# Patient Record
Sex: Female | Born: 1975 | Race: White | Hispanic: No | Marital: Married | Smoking: Former smoker
Health system: Southern US, Community
[De-identification: ages and names within clinical notes are randomized; demographics above are authoritative.]

## PROBLEM LIST (undated history)

## (undated) DIAGNOSIS — C4491 Basal cell carcinoma of skin, unspecified: Secondary | ICD-10-CM

## (undated) DIAGNOSIS — G43909 Migraine, unspecified, not intractable, without status migrainosus: Secondary | ICD-10-CM

## (undated) DIAGNOSIS — F419 Anxiety disorder, unspecified: Secondary | ICD-10-CM

## (undated) DIAGNOSIS — M797 Fibromyalgia: Secondary | ICD-10-CM

## (undated) DIAGNOSIS — G894 Chronic pain syndrome: Secondary | ICD-10-CM

## (undated) HISTORY — DX: Chronic pain syndrome: G89.4

## (undated) HISTORY — PX: THYROIDECTOMY, PARTIAL: SHX18

## (undated) HISTORY — DX: Migraine, unspecified, not intractable, without status migrainosus: G43.909

## (undated) HISTORY — DX: Fibromyalgia: M79.7

## (undated) HISTORY — DX: Anxiety disorder, unspecified: F41.9

## (undated) HISTORY — DX: Basal cell carcinoma of skin, unspecified: C44.91

## (undated) HISTORY — PX: UMBILICAL HERNIA REPAIR: SHX196

---

## 2012-03-03 ENCOUNTER — Ambulatory Visit: Payer: Managed Care, Other (non HMO) | Attending: Internal Medicine | Admitting: Physical Therapy

## 2012-03-03 DIAGNOSIS — IMO0001 Reserved for inherently not codable concepts without codable children: Secondary | ICD-10-CM | POA: Insufficient documentation

## 2012-03-03 DIAGNOSIS — M629 Disorder of muscle, unspecified: Secondary | ICD-10-CM | POA: Insufficient documentation

## 2012-03-03 DIAGNOSIS — M2420 Disorder of ligament, unspecified site: Secondary | ICD-10-CM | POA: Insufficient documentation

## 2012-03-03 DIAGNOSIS — M2569 Stiffness of other specified joint, not elsewhere classified: Secondary | ICD-10-CM | POA: Insufficient documentation

## 2012-03-04 ENCOUNTER — Ambulatory Visit: Payer: Managed Care, Other (non HMO) | Admitting: Physical Therapy

## 2012-03-08 ENCOUNTER — Ambulatory Visit: Payer: Managed Care, Other (non HMO) | Admitting: Physical Therapy

## 2012-03-10 ENCOUNTER — Ambulatory Visit: Payer: Managed Care, Other (non HMO) | Admitting: Physical Therapy

## 2012-03-18 ENCOUNTER — Ambulatory Visit: Payer: Managed Care, Other (non HMO) | Admitting: Physical Therapy

## 2012-03-22 ENCOUNTER — Ambulatory Visit: Payer: Managed Care, Other (non HMO) | Admitting: Physical Therapy

## 2012-03-24 ENCOUNTER — Encounter: Payer: Managed Care, Other (non HMO) | Admitting: Physical Therapy

## 2012-03-29 ENCOUNTER — Ambulatory Visit: Payer: Managed Care, Other (non HMO) | Admitting: Physical Therapy

## 2012-03-31 ENCOUNTER — Ambulatory Visit: Payer: Managed Care, Other (non HMO) | Admitting: Physical Therapy

## 2012-04-07 ENCOUNTER — Ambulatory Visit: Payer: Managed Care, Other (non HMO) | Admitting: Physical Therapy

## 2012-04-12 ENCOUNTER — Ambulatory Visit: Payer: Managed Care, Other (non HMO) | Attending: Internal Medicine | Admitting: Physical Therapy

## 2012-04-12 DIAGNOSIS — M2420 Disorder of ligament, unspecified site: Secondary | ICD-10-CM | POA: Insufficient documentation

## 2012-04-12 DIAGNOSIS — M2569 Stiffness of other specified joint, not elsewhere classified: Secondary | ICD-10-CM | POA: Insufficient documentation

## 2012-04-12 DIAGNOSIS — IMO0001 Reserved for inherently not codable concepts without codable children: Secondary | ICD-10-CM | POA: Insufficient documentation

## 2012-04-12 DIAGNOSIS — M629 Disorder of muscle, unspecified: Secondary | ICD-10-CM | POA: Insufficient documentation

## 2012-04-14 ENCOUNTER — Encounter: Payer: Managed Care, Other (non HMO) | Admitting: Physical Therapy

## 2012-04-19 ENCOUNTER — Ambulatory Visit: Payer: Managed Care, Other (non HMO) | Admitting: Physical Therapy

## 2012-04-21 ENCOUNTER — Ambulatory Visit: Payer: Managed Care, Other (non HMO) | Admitting: Physical Therapy

## 2012-04-26 ENCOUNTER — Ambulatory Visit: Payer: Managed Care, Other (non HMO) | Admitting: Physical Therapy

## 2012-04-28 ENCOUNTER — Ambulatory Visit: Payer: Managed Care, Other (non HMO) | Admitting: Physical Therapy

## 2012-05-06 ENCOUNTER — Ambulatory Visit (HOSPITAL_COMMUNITY)
Admission: RE | Admit: 2012-05-06 | Discharge: 2012-05-06 | Disposition: A | Discharge status: Home / Self Care | Payer: Managed Care, Other (non HMO) | Source: Ambulatory Visit | Attending: Chiropractic Medicine | Admitting: Chiropractic Medicine

## 2012-05-06 ENCOUNTER — Other Ambulatory Visit (HOSPITAL_COMMUNITY): Payer: Self-pay | Admitting: Chiropractic Medicine

## 2012-05-06 ENCOUNTER — Ambulatory Visit: Payer: Managed Care, Other (non HMO) | Admitting: Physical Therapy

## 2012-05-06 DIAGNOSIS — IMO0002 Reserved for concepts with insufficient information to code with codable children: Secondary | ICD-10-CM

## 2012-05-06 DIAGNOSIS — M25559 Pain in unspecified hip: Secondary | ICD-10-CM | POA: Insufficient documentation

## 2012-05-06 DIAGNOSIS — M545 Low back pain, unspecified: Secondary | ICD-10-CM | POA: Insufficient documentation

## 2012-05-06 DIAGNOSIS — M549 Dorsalgia, unspecified: Secondary | ICD-10-CM | POA: Insufficient documentation

## 2012-05-09 ENCOUNTER — Ambulatory Visit: Payer: Managed Care, Other (non HMO) | Admitting: Physical Therapy

## 2012-05-11 ENCOUNTER — Ambulatory Visit: Payer: Managed Care, Other (non HMO) | Attending: Internal Medicine | Admitting: Physical Therapy

## 2012-05-11 DIAGNOSIS — IMO0001 Reserved for inherently not codable concepts without codable children: Secondary | ICD-10-CM | POA: Insufficient documentation

## 2012-05-11 DIAGNOSIS — M629 Disorder of muscle, unspecified: Secondary | ICD-10-CM | POA: Insufficient documentation

## 2012-05-11 DIAGNOSIS — M2569 Stiffness of other specified joint, not elsewhere classified: Secondary | ICD-10-CM | POA: Insufficient documentation

## 2012-05-11 DIAGNOSIS — M2420 Disorder of ligament, unspecified site: Secondary | ICD-10-CM | POA: Insufficient documentation

## 2012-05-16 ENCOUNTER — Ambulatory Visit: Payer: Managed Care, Other (non HMO) | Admitting: Physical Therapy

## 2012-05-19 ENCOUNTER — Ambulatory Visit: Payer: Managed Care, Other (non HMO) | Admitting: Physical Therapy

## 2012-05-23 ENCOUNTER — Ambulatory Visit: Payer: Managed Care, Other (non HMO) | Admitting: Physical Therapy

## 2012-05-26 ENCOUNTER — Ambulatory Visit: Payer: Managed Care, Other (non HMO) | Admitting: Physical Therapy

## 2012-05-30 ENCOUNTER — Encounter: Payer: Managed Care, Other (non HMO) | Admitting: Physical Therapy

## 2012-06-01 ENCOUNTER — Ambulatory Visit: Payer: Managed Care, Other (non HMO) | Admitting: Physical Therapy

## 2012-06-02 ENCOUNTER — Ambulatory Visit: Payer: Managed Care, Other (non HMO) | Admitting: Physical Therapy

## 2012-06-06 ENCOUNTER — Ambulatory Visit: Payer: Managed Care, Other (non HMO) | Attending: Internal Medicine | Admitting: Physical Therapy

## 2012-06-06 DIAGNOSIS — M2569 Stiffness of other specified joint, not elsewhere classified: Secondary | ICD-10-CM | POA: Insufficient documentation

## 2012-06-06 DIAGNOSIS — IMO0001 Reserved for inherently not codable concepts without codable children: Secondary | ICD-10-CM | POA: Insufficient documentation

## 2012-06-06 DIAGNOSIS — M629 Disorder of muscle, unspecified: Secondary | ICD-10-CM | POA: Insufficient documentation

## 2012-06-06 DIAGNOSIS — M2420 Disorder of ligament, unspecified site: Secondary | ICD-10-CM | POA: Insufficient documentation

## 2012-06-09 ENCOUNTER — Ambulatory Visit: Payer: Managed Care, Other (non HMO) | Admitting: Physical Therapy

## 2012-06-14 ENCOUNTER — Ambulatory Visit: Payer: Managed Care, Other (non HMO) | Admitting: Physical Therapy

## 2012-06-17 ENCOUNTER — Ambulatory Visit: Payer: Managed Care, Other (non HMO) | Admitting: Physical Therapy

## 2012-06-20 ENCOUNTER — Encounter: Payer: Managed Care, Other (non HMO) | Admitting: Physical Therapy

## 2012-06-22 ENCOUNTER — Ambulatory Visit: Payer: Managed Care, Other (non HMO) | Admitting: Physical Therapy

## 2012-06-27 ENCOUNTER — Encounter: Payer: Managed Care, Other (non HMO) | Admitting: Physical Therapy

## 2012-06-28 ENCOUNTER — Encounter: Payer: Managed Care, Other (non HMO) | Admitting: Physical Therapy

## 2013-08-03 HISTORY — PX: SHOULDER ARTHROSCOPY W/ SUPERIOR LABRAL ANTERIOR POSTERIOR LESION REPAIR: SHX2402

## 2013-12-07 ENCOUNTER — Other Ambulatory Visit: Payer: Self-pay | Admitting: Physical Medicine and Rehabilitation

## 2013-12-07 DIAGNOSIS — E041 Nontoxic single thyroid nodule: Secondary | ICD-10-CM

## 2013-12-12 ENCOUNTER — Ambulatory Visit
Admission: RE | Admit: 2013-12-12 | Discharge: 2013-12-12 | Disposition: A | Discharge status: Home / Self Care | Payer: Managed Care, Other (non HMO) | Source: Ambulatory Visit | Attending: Physical Medicine and Rehabilitation | Admitting: Physical Medicine and Rehabilitation

## 2013-12-12 ENCOUNTER — Other Ambulatory Visit: Payer: Managed Care, Other (non HMO)

## 2013-12-12 DIAGNOSIS — E041 Nontoxic single thyroid nodule: Secondary | ICD-10-CM

## 2013-12-29 ENCOUNTER — Ambulatory Visit (INDEPENDENT_AMBULATORY_CARE_PROVIDER_SITE_OTHER): Payer: Managed Care, Other (non HMO) | Admitting: Surgery

## 2014-01-09 ENCOUNTER — Encounter: Payer: Self-pay | Admitting: *Deleted

## 2014-01-10 ENCOUNTER — Ambulatory Visit: Payer: Managed Care, Other (non HMO) | Admitting: Neurology

## 2014-01-10 ENCOUNTER — Telehealth: Payer: Self-pay | Admitting: Neurology

## 2014-01-10 NOTE — Telephone Encounter (Signed)
No show for new patient visit on 6/10

## 2014-01-12 ENCOUNTER — Ambulatory Visit (INDEPENDENT_AMBULATORY_CARE_PROVIDER_SITE_OTHER): Payer: Managed Care, Other (non HMO) | Admitting: Neurology

## 2014-01-12 ENCOUNTER — Encounter: Payer: Self-pay | Admitting: Neurology

## 2014-01-12 ENCOUNTER — Encounter (INDEPENDENT_AMBULATORY_CARE_PROVIDER_SITE_OTHER): Payer: Self-pay

## 2014-01-12 VITALS — BP 124/82 | HR 89 | Ht 61.75 in | Wt 111.0 lb

## 2014-01-12 DIAGNOSIS — G8929 Other chronic pain: Secondary | ICD-10-CM

## 2014-01-12 DIAGNOSIS — R209 Unspecified disturbances of skin sensation: Secondary | ICD-10-CM

## 2014-01-12 DIAGNOSIS — R202 Paresthesia of skin: Secondary | ICD-10-CM

## 2014-01-12 DIAGNOSIS — M62838 Other muscle spasm: Secondary | ICD-10-CM

## 2014-01-12 DIAGNOSIS — R52 Pain, unspecified: Secondary | ICD-10-CM

## 2014-01-12 NOTE — Progress Notes (Signed)
[facility]    Provider:  Dr Janann Colonel Referring Provider: No ref. provider found Primary Care Physician:  Chesley Noon, MD  CC:  Chronic pain  HPI:  Meagan Thornton is a 38 y.o. female here as a referral from Dr. Nelva Bush for chronic diffuse pain  Symptoms started around 5 years ago, has started to involve more and more of the right side of her body. Notes large muscle knots and cramping of her muscles. Whole right side is in constant pain, starts proximally and then radiates distally. Describes as a throbbing aching type pain. Notes numbness and pins and needles sensation throughout the whole body. Has been receiving dry needling with Dr Nelva Bush which gives transient brief benefit.   Also notes frequent headaches, can be generalized, pressure type pain. Gets some associated nausea, + photo and phonophobia.  No history of blurry vision, loss of vision.   Of note, her mother had muscular dystrophy but patient reports being genetically tested and was negative.   MRI Right shoulder: mild rotator cuff tendinosis MRI C spine C6-7 disc extrusion with mild flattening of the ventral thecal sac and mild central canal stenosis, Left thyroid nodule MRI Lumbar spine: L4-5 mild disc bulge and small protrusion with mild displacement of the Left L4-5 nerve roots.  Review of Systems: Out of a complete 14 system review, the patient complains of only the following symptoms, and all other reviewed systems are negative. + anemia, weight loss, fatigue, headache, numbness, weakness, insomnia  History   Social History  . Marital Status: Married    Spouse Name: N/A    Number of Children: N/A  . Years of Education: N/A   Occupational History  . Not on file.   Social History Main Topics  . Smoking status: Not on file  . Smokeless tobacco: Not on file  . Alcohol Use: Not on file  . Drug Use: Not on file  . Sexual Activity: Not on file   Other Topics Concern  . Not on file    Social History Narrative  . No narrative on file    No family history on file.  No past medical history on file.  No past surgical history on file.  Current Outpatient Prescriptions  Medication Sig Dispense Refill  . acyclovir (ZOVIRAX) 400 MG tablet Take 400 mg by mouth 5 (five) times daily.      . diazepam (VALIUM) 10 MG tablet Take 10 mg by mouth every 6 (six) hours as needed for anxiety.      . DULoxetine (CYMBALTA) 30 MG capsule Take 30 mg by mouth daily.      . ferrous sulfate 325 (65 FE) MG tablet Take 325 mg by mouth daily with breakfast.      . hydrOXYzine (ATARAX/VISTARIL) 25 MG tablet Take 25 mg by mouth 3 (three) times daily as needed.      . magnesium 30 MG tablet Take 30 mg by mouth 2 (two) times daily.      . methocarbamol (ROBAXIN) 500 MG tablet Take 500 mg by mouth 4 (four) times daily.      . MULTIPLE VITAMINS PO Take by mouth.      . progesterone (PROMETRIUM) 200 MG capsule Take 200 mg by mouth daily.      . Tapentadol HCl (NUCYNTA) 75 MG TABS Take 75 mg by mouth 3 (three) times daily as needed.       No current facility-administered medications for this visit.    Allergies as of 01/12/2014 -  never reviewed  Allergen Reaction Noted  . Hydrocodone-acetaminophen  01/12/2014  . Oxycodone-acetaminophen Nausea And Vomiting 01/12/2014    Vitals: BP 124/82  Pulse 89  Ht 5' 1.75" (1.568 m)  Wt 111 lb (50.349 kg)  BMI 20.48 kg/m2 Last Weight:  Wt Readings from Last 1 Encounters:  01/12/14 111 lb (50.349 kg)   Last Height:   Ht Readings from Last 1 Encounters:  01/12/14 5' 1.75" (1.568 m)     Physical exam: Exam: Gen: NAD, conversant Eyes: anicteric sclerae, moist conjunctivae HENT: Atraumatic, oropharynx clear Neck: Trachea midline; supple,  Lungs: CTA, no wheezing, rales, rhonic                          CV: RRR, no MRG Abdomen: Soft, non-tender;  Extremities: No peripheral edema  Skin: Normal temperature, no rash,  Psych: Appropriate affect,  pleasant  Neuro: MS: AA&Ox3, appropriately interactive, normal affect   Attention: WORLD backwards  Speech: fluent w/o paraphasic error  Memory: good recent and remote recall  CN: PERRL, EOMI no nystagmus, no ptosis, sensation intact to LT V1-V3 bilat, face symmetric, no weakness, hearing grossly intact, palate elevates symmetrically, shoulder shrug 5/5 bilat,  tongue protrudes midline, no fasiculations noted.  Motor: normal bulk and tone, multiple "knots" palpated on the right side Strength: 5/5  In all extremities with some giveaway weakness of R hip flexors  Coord: rapid alternating and point-to-point (FNF, HTS) movements intact.  Reflexes: symmetrical, bilat downgoing toes  Sens: LT intact in all extremities  Gait: posture, stance, stride and arm-swing normal. Tandem gait intact. Able to walk on heels and toes. Romberg absent.   Assessment:  After physical and neurologic examination, review of laboratory studies, imaging, neurophysiology testing and pre-existing records, assessment will be reviewed on the problem list.  Plan:  Treatment plan and additional workup will be reviewed under Problem List.  1)Chronic pain 2)Muscle spasms 3)Paresthesias  37y/o woman presenting for initial evaluation of chronic muscle pain. Etiology unclear. She notes a family history of muscular dystrophy but notes normal genetic testing. Unilateral nature of symptoms raises question of a central process, would consider MS though this would be atypical. Will check MRI brain, EMG, lab workup. Will increase Cymbalta to 60mg  once a day. She is scheduled to follow up with Dr Niel Hummer. Will follow up once workup completed.  Jim Like, DO  [facility] [address] [place], [place] [address]  Phone [phone] Fax [phone]

## 2014-01-12 NOTE — Patient Instructions (Signed)
Overall you are doing fairly well but I do want to suggest a few things today:   Remember to drink plenty of fluid, eat healthy meals and do not skip any meals. Try to eat protein with a every meal and eat a healthy snack such as fruit or nuts in between meals. Try to keep a regular sleep-wake schedule and try to exercise daily, particularly in the form of walking, 20-30 minutes a day, if you can.   As far as diagnostic testing:  1)Please have some blood work completed today after you check out 2)Please schedule an EMG when you check out 3)I would like you to have a MRI of the brain, you will be called to schedule this  We will follow up once the workup is completed.  Please call us with any interim questions, concerns, problems, updates or refill requests.   My clinical assistant and will answer any of your questions and relay your messages to me and also relay most of my messages to you.   Our phone number is [phone]. We also have an after hours call service for urgent matters and there is a physician on-call for urgent questions. For any emergencies you know to call 911 or go to the nearest emergency room

## 2014-01-14 LAB — ANA W/REFLEX IF POSITIVE
Anti Nuclear Antibody(ANA): POSITIVE — AB
Chromatin Ab SerPl-aCnc: <0.2 AI (ref 0.0–0.9)
DSDNA AB: 1 [IU]/mL (ref 0–9)
ENA RNP AB: 1.2 AI — AB (ref 0.0–0.9)
ENA SM Ab Ser-aCnc: <0.2 AI (ref 0.0–0.9)
ENA SSA (RO) Ab: <0.2 AI (ref 0.0–0.9)
ENA SSB (LA) Ab: <0.2 AI (ref 0.0–0.9)
Scleroderma SCL-70: <0.2 AI (ref 0.0–0.9)

## 2014-01-14 LAB — T4, FREE: FREE T4: 0.97 ng/dL (ref 0.82–1.77)

## 2014-01-14 LAB — CK: CK TOTAL: 227 U/L — AB (ref 24–173)

## 2014-01-14 LAB — TSH: TSH: 1.77 u[IU]/mL (ref 0.450–4.500)

## 2014-01-16 ENCOUNTER — Ambulatory Visit (INDEPENDENT_AMBULATORY_CARE_PROVIDER_SITE_OTHER): Payer: Managed Care, Other (non HMO) | Admitting: Neurology

## 2014-01-16 ENCOUNTER — Encounter (INDEPENDENT_AMBULATORY_CARE_PROVIDER_SITE_OTHER): Payer: Self-pay

## 2014-01-16 DIAGNOSIS — R52 Pain, unspecified: Secondary | ICD-10-CM

## 2014-01-16 DIAGNOSIS — Z0289 Encounter for other administrative examinations: Secondary | ICD-10-CM

## 2014-01-16 DIAGNOSIS — M62838 Other muscle spasm: Secondary | ICD-10-CM

## 2014-01-16 DIAGNOSIS — R209 Unspecified disturbances of skin sensation: Secondary | ICD-10-CM

## 2014-01-16 DIAGNOSIS — R202 Paresthesia of skin: Secondary | ICD-10-CM

## 2014-01-16 DIAGNOSIS — G8929 Other chronic pain: Secondary | ICD-10-CM

## 2014-01-16 NOTE — Procedures (Signed)
   NCS (NERVE CONDUCTION STUDY) WITH EMG (ELECTROMYOGRAPHY) REPORT   STUDY DATE:  June 16th 2015 PATIENT NAME: Meagan Thornton DOB: [birth year] MRN: [id number]    TECHNOLOGIST: Laretta Alstrom ELECTROMYOGRAPHER: Marcial Pacas M.D.  CLINICAL INFORMATION:  38 years old Caucasian female, presenting with chronic diffuse body aching pain worsening on the right side, also with subjective weakness involving the right arm, right lower extremity, recent right rotator cuff surgery.   On examination, normal bilateral upper lower extremity strength, deep tendon reflex was present and symmetric, well healed right shoulder arthroscopic surgical scar  FINDINGS: NERVE CONDUCTION STUDY: Right peroneal sensory response was normal, right peroneal to EDB, right tibial motor response were normal. Right median, ulnar sensory and motor responses were normal   NEEDLE ELECTROMYOGRAPHY: Selected needle examination was performed at right upper, lower extremity muscles, right cervical, lumbar paraspinal muscles.    Needle examination of right biceps, triceps, deltoid, extensor digital communis, pronator teres was normal  There was no spontaneous activity at right cervical paraspinal muscles, right C5, 6, 7  Needle examination of right tibialis anterior, tibialis posterior, peroneal longus, medial gastrocnemius, vastus lateralis was normal  There was no spontaneous activity at right lumbosacral paraspinal muscles, right L4-5 S1.   IMPRESSION:   This is a normal study. There was no electrodiagnostic evidence of large fiber peripheral neuropathy, right cervical, right lumbosacral radiculopathy    INTERPRETING PHYSICIAN:   Marcial Pacas M.D. Ph.D. [facility] [address], [place], [place] [address] [phone]

## 2014-01-17 ENCOUNTER — Ambulatory Visit (INDEPENDENT_AMBULATORY_CARE_PROVIDER_SITE_OTHER): Payer: Managed Care, Other (non HMO)

## 2014-01-17 DIAGNOSIS — R209 Unspecified disturbances of skin sensation: Secondary | ICD-10-CM

## 2014-01-17 DIAGNOSIS — R52 Pain, unspecified: Secondary | ICD-10-CM

## 2014-01-17 DIAGNOSIS — R202 Paresthesia of skin: Secondary | ICD-10-CM

## 2014-01-17 DIAGNOSIS — M62838 Other muscle spasm: Secondary | ICD-10-CM

## 2014-01-19 ENCOUNTER — Other Ambulatory Visit: Payer: Self-pay | Admitting: Neurology

## 2014-01-19 DIAGNOSIS — IMO0001 Reserved for inherently not codable concepts without codable children: Secondary | ICD-10-CM

## 2014-04-12 ENCOUNTER — Other Ambulatory Visit: Payer: Self-pay | Admitting: Physical Medicine and Rehabilitation

## 2014-04-12 DIAGNOSIS — E041 Nontoxic single thyroid nodule: Secondary | ICD-10-CM

## 2014-04-24 ENCOUNTER — Ambulatory Visit
Admission: RE | Admit: 2014-04-24 | Discharge: 2014-04-24 | Disposition: A | Discharge status: Home / Self Care | Payer: Managed Care, Other (non HMO) | Source: Ambulatory Visit | Attending: Physical Medicine and Rehabilitation | Admitting: Physical Medicine and Rehabilitation

## 2014-04-24 ENCOUNTER — Other Ambulatory Visit (HOSPITAL_COMMUNITY)
Admission: RE | Admit: 2014-04-24 | Discharge: 2014-04-24 | Disposition: A | Discharge status: Home / Self Care | Payer: Managed Care, Other (non HMO) | Source: Ambulatory Visit | Attending: Interventional Radiology | Admitting: Interventional Radiology

## 2014-04-24 DIAGNOSIS — E041 Nontoxic single thyroid nodule: Secondary | ICD-10-CM | POA: Diagnosis present

## 2015-12-27 IMAGING — US US SOFT TISSUE HEAD/NECK
1 series · 13 of 25 positions shown · non-contrast
Comparison: None.

CLINICAL DATA: History of partial resection of the right and left
lobes of the thyroid.

EXAM:
THYROID ULTRASOUND
TECHNIQUE: Ultrasound examination of the thyroid gland and adjacent soft
tissues was performed.

[Series 1: us soft tissue head/neck · 0.05mm/px · 13 of 66 slices shown]
[im 1/66]
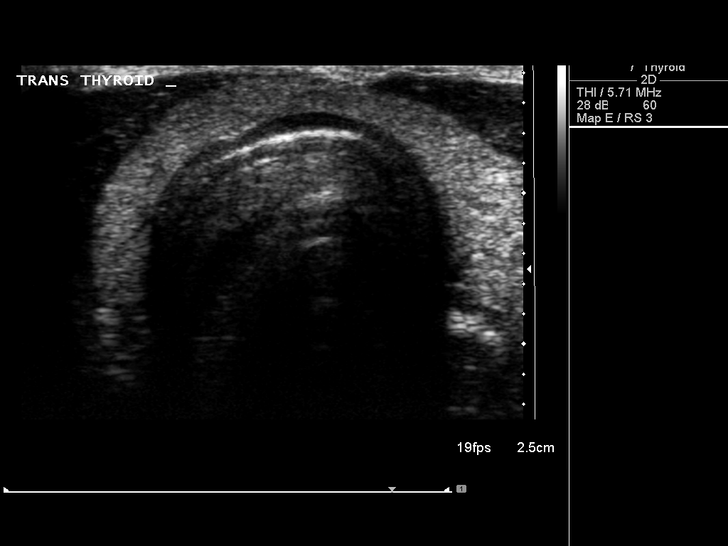
[im 6/66]
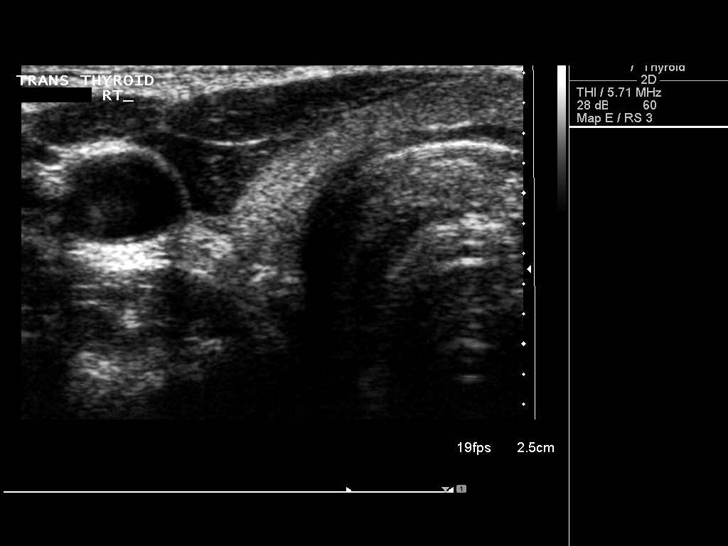
[im 11/66]
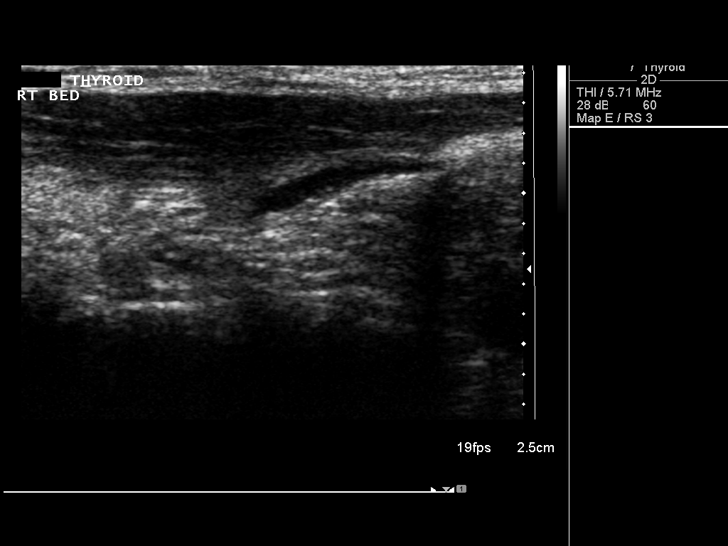
[im 17/66]
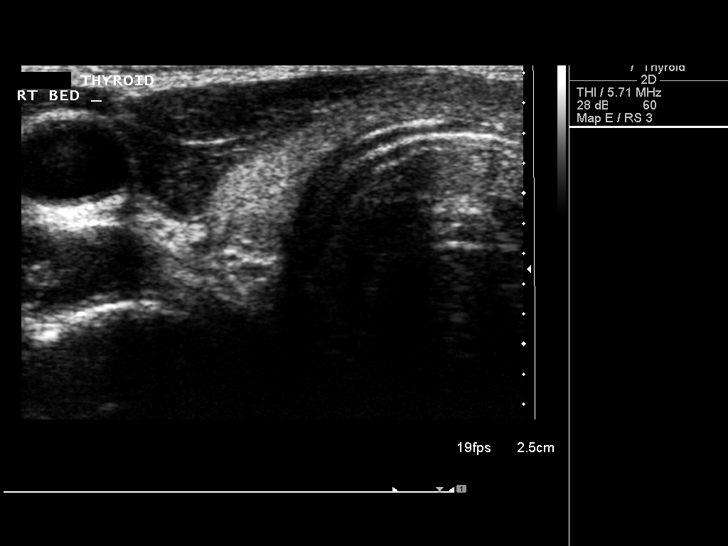
[im 22/66]
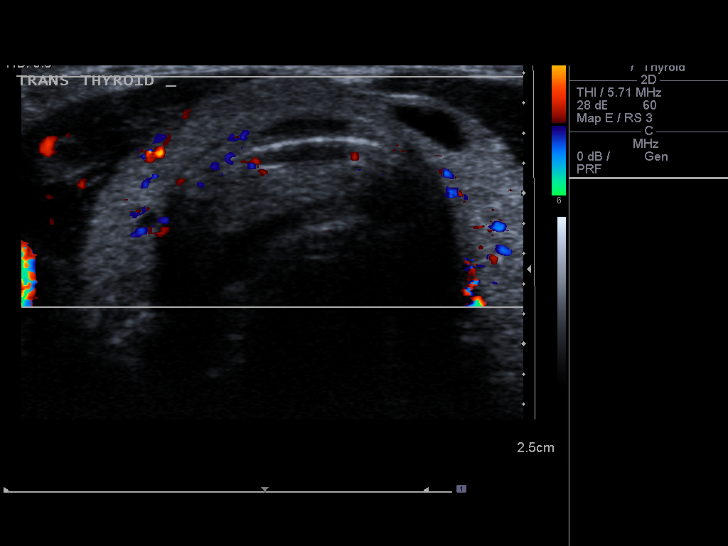
[im 28/66]
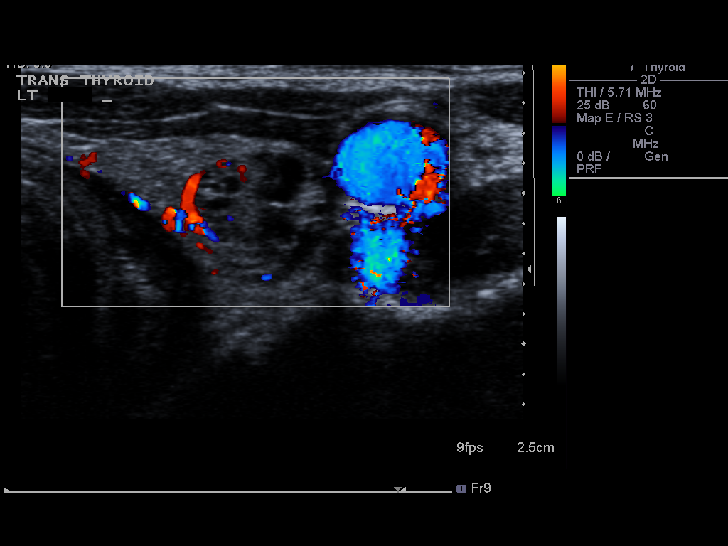
[im 33/66]
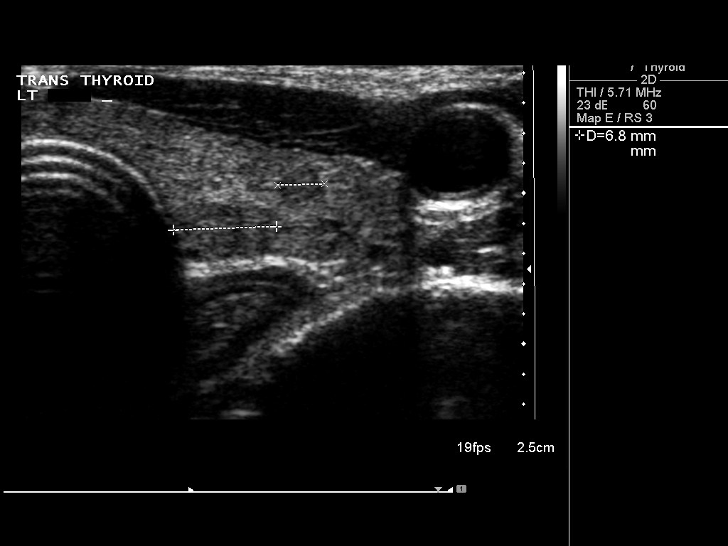
[im 38/66]
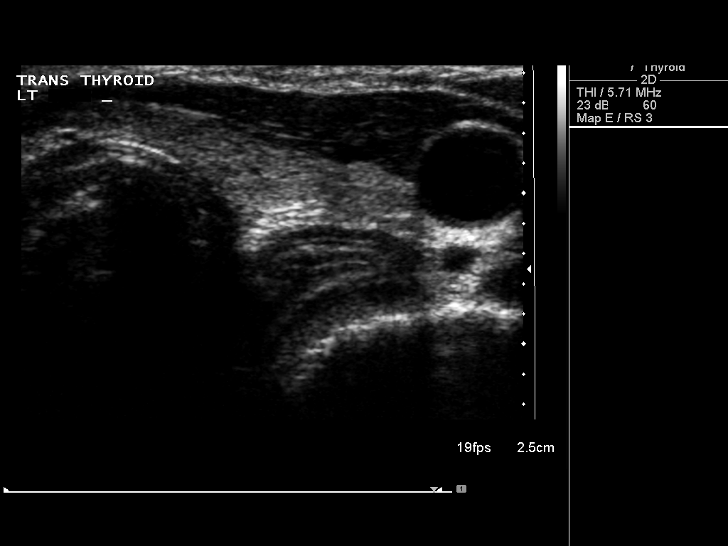
[im 44/66]
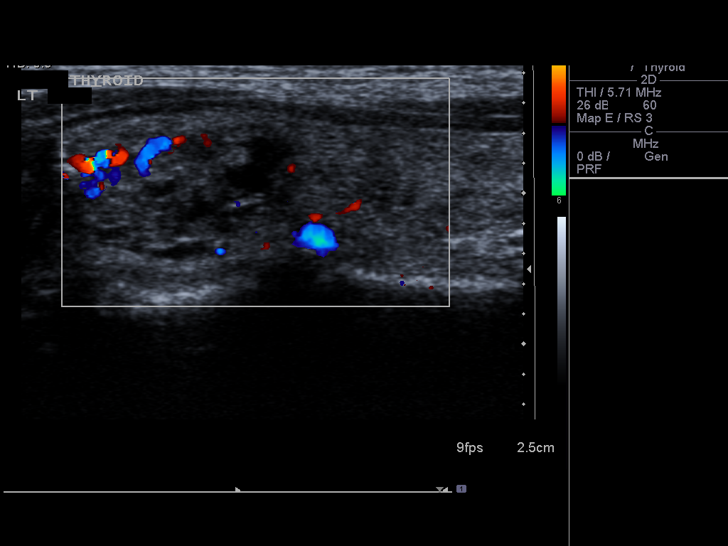
[im 49/66]
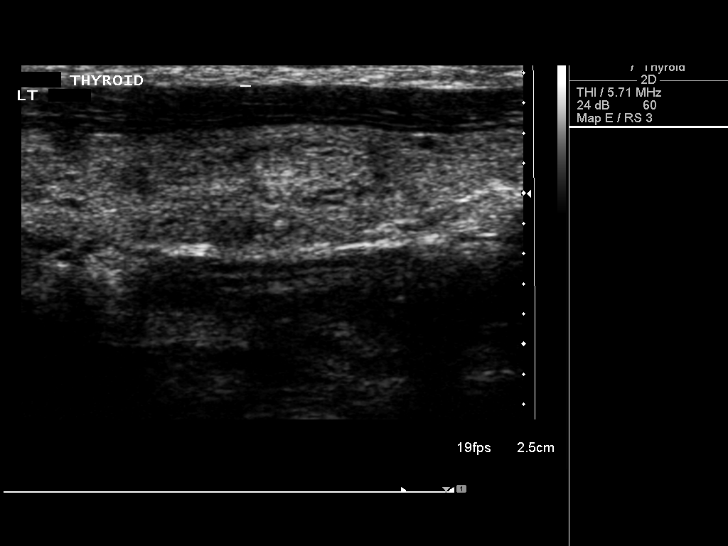
[im 55/66]
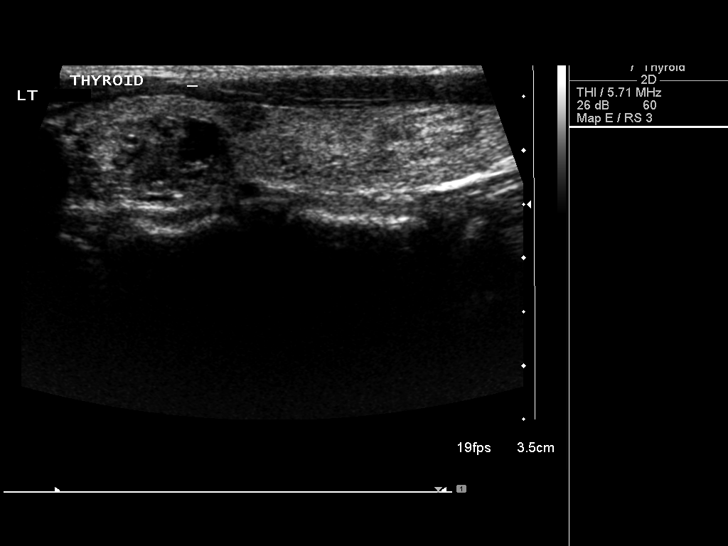
[im 60/66]
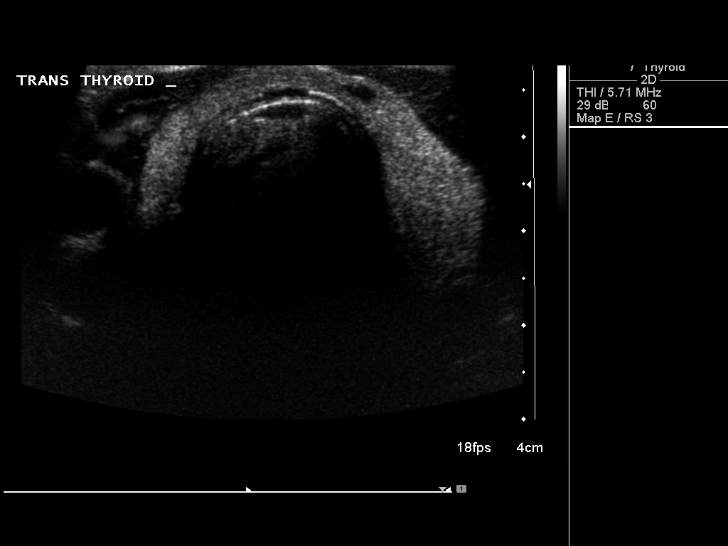
[im 66/66]
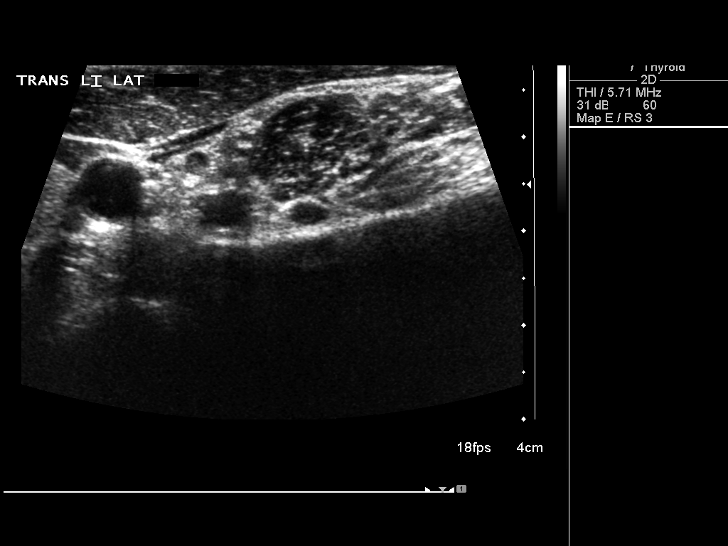

[13 of 25 positions shown; findings below may reference images not displayed]

FINDINGS: Right thyroid lobe

Measurements: Atrophic measuring 2.4 x 0.7 x 0.6 cm.

No discrete nodules are identified within the residual portions of
the right lobe of the thyroid.

Left thyroid lobe

Measurements: Normal in size measuring 5.5 x 0.9 x 1.5 cm..

*Left, superior - 1.5 x 0.9 x 1.0 cm - mixed echogenic, partially
cystic, predominantly solid.

Left, inferior - 1.0 x 0.5 x 0.8 cm- isoechoic, solid

Left, mid, posterior - 0.7 x 0.6 x 0.4 cm - slightly hypoechoic,
solid

Several additional sub 5 mm nodules are also identified within the
left lobe of the thyroid.

Isthmus

Thickness: Normal in size measuring 0.25 cm in diameter..

Left, lateral - 0.5 cm - anechoic, cystic

Lymphadenopathy

None visualized.
IMPRESSION: 1. Post partial resection of the right lobe of the thyroid. No
residual nodules are identified within the residual right lobe of
the thyroid.
2. Multiple nodules are identified within the left lobe of the
thyroid, suggestive of multinodular goiter. The dominant
approximately 1.5 cm nodule within the superior aspect of the left
lobe of the thyroid meets imaging criteria to recommend percutaneous
sampling as clinically indicated. This recommendation follows the
consensus statement: Management of Thyroid Nodules Detected at US:
Society of Radiologists in Ultrasound Consensus Conference

## 2016-05-04 ENCOUNTER — Ambulatory Visit (INDEPENDENT_AMBULATORY_CARE_PROVIDER_SITE_OTHER): Payer: Managed Care, Other (non HMO) | Admitting: Neurology

## 2016-05-05 ENCOUNTER — Encounter: Payer: Self-pay | Admitting: *Deleted

## 2016-05-05 ENCOUNTER — Ambulatory Visit (INDEPENDENT_AMBULATORY_CARE_PROVIDER_SITE_OTHER): Payer: Managed Care, Other (non HMO) | Admitting: Neurology

## 2016-05-05 ENCOUNTER — Encounter: Payer: Self-pay | Admitting: Neurology

## 2016-05-05 VITALS — BP 118/80 | HR 79 | Ht 61.75 in | Wt 121.5 lb

## 2016-05-05 DIAGNOSIS — R202 Paresthesia of skin: Secondary | ICD-10-CM

## 2016-05-05 DIAGNOSIS — M79609 Pain in unspecified limb: Secondary | ICD-10-CM | POA: Insufficient documentation

## 2016-05-05 DIAGNOSIS — G8929 Other chronic pain: Secondary | ICD-10-CM

## 2016-05-05 DIAGNOSIS — M542 Cervicalgia: Secondary | ICD-10-CM | POA: Insufficient documentation

## 2016-05-05 DIAGNOSIS — M5441 Lumbago with sciatica, right side: Secondary | ICD-10-CM

## 2016-05-05 NOTE — Progress Notes (Signed)
PATIENT: Meagan Thornton DOB: 1976-01-07  Chief Complaint  Patient presents with  . Chronic Muscle Pain/Numbness    Reports constant numbness in neck, arms, and legs over the last six years that is progressively getting worse.     HISTORICAL  Meagan Thornton is a 40 years old right-handed female, seen in refer by her pattern management Dr. Suella Broad, for evaluation of whole body achy pain, her primary care physician is Dr. Chesley Noon, initial evaluation was on October third 2017  She is tearful during today's interview, she has a lot of family stress, she has 2 sons at age 57, and 5 years old, her 42-year-old son carried a diagnosis of CHARGE syndrome, needing a lot of care, he had trach, and feeding tube.  Since 2013, the patient complains of gradual worsening functional status, whole body achy pain, she used to be a Physiological scientist, was able to run half marathons, now she complains of whole body achy pain, especially her right neck, radiating pain to her right shoulder right arm, right side low back pain, radiating pain to her right hip, right leg, subjective right side paresthesia, weakness.  She can still run 4 miles but feel exhausted afterwards, right pelvic, chest area achy pain, multiple tender spots throughout her body, over the years, she has tried yoga, dry needling, pain management with limited help, she is currently on polypharmacy, including methocarbamol 500 mg 4 times a day, Nucynta 75 mg 3 times a day,, Valium as needed, Cymbalta 60 mg daily, multiple over counter supplement, she could not tolerate Lyrica, make her drowsy   REVIEW OF SYSTEMS: Full 14 system review of systems performed and notable only for fatigue, itching, shortness of breath, constipation, urination problem, anemia, easy bruising, joint pain, achy muscles, skin sensitivity, headaches, numbness, weakness, insomnia, not enough sleep  ALLERGIES: Allergies  Allergen Reactions  .  Hydrocodone-Acetaminophen     Other reaction(s): VOMITING  . Oxycodone-Acetaminophen Nausea And Vomiting    HOME MEDICATIONS: Current Outpatient Prescriptions  Medication Sig Dispense Refill  . acyclovir (ZOVIRAX) 400 MG tablet Take 400 mg by mouth daily as needed.     . diazepam (VALIUM) 10 MG tablet Take 10 mg by mouth every 6 (six) hours as needed for anxiety.    Meagan Thornton Take 18 mg by mouth. Take four tablets daily.    . methocarbamol (ROBAXIN) 500 MG tablet Take 500 mg by mouth 4 (four) times daily.    . MULTIPLE VITAMINS Thornton Take by mouth.    . progesterone (PROMETRIUM) 200 MG capsule Take 200 mg by mouth daily.    . Tapentadol HCl (NUCYNTA) 75 MG TABS Take 75 mg by mouth 3 (three) times daily as needed.     No current facility-administered medications for this visit.     PAST MEDICAL HISTORY: Past Medical History:  Diagnosis Date  . Anxiety   . Basal cell carcinoma   . Chronic pain syndrome   . Fibromyalgia   . Migraine     PAST SURGICAL HISTORY: Past Surgical History:  Procedure Laterality Date  . CESAREAN SECTION  2008  . SHOULDER ARTHROSCOPY W/ SUPERIOR LABRAL ANTERIOR POSTERIOR LESION REPAIR Right 2015  . THYROIDECTOMY, PARTIAL  2004, 2012  . UMBILICAL HERNIA REPAIR  2012, 2013    FAMILY HISTORY: Family History  Problem Relation Age of Onset  . Muscular dystrophy Mother   . Heart failure Mother   . Breast cancer Maternal Grandmother     SOCIAL  HISTORY:  Social History   Social History  . Marital status: Married    Spouse name: N/A  . Number of children: 2  . Years of education: Bachelors   Occupational History  . Homemaker    Social History Main Topics  . Smoking status: Former Research scientist (life sciences)  . Smokeless tobacco: Never Used  . Alcohol use Yes     Comment: socially - couple times per year  . Drug use: No  . Sexual activity: Not on file   Other Topics Concern  . Not on file   Social History Narrative   Lives at home with husband and  children.   Married, 2 children   Right handed   Bachelor   2 cups caffeine daily     PHYSICAL EXAM   Vitals:   05/05/16 0812  BP: 118/80  Pulse: 79  Weight: 121 lb 8 oz (55.1 kg)  Height: 5' 1.75" (1.568 m)    Not recorded      Body mass index is 22.4 kg/m.  PHYSICAL EXAMNIATION:  Gen: NAD, conversant, well nourised, obese, well groomed                     Cardiovascular: Regular rate rhythm, no peripheral edema, warm, nontender. Eyes: Conjunctivae clear without exudates or hemorrhage Neck: Supple, no carotid bruise. Pulmonary: Clear to auscultation bilaterally   NEUROLOGICAL EXAM:  MENTAL STATUS:Stress looking middle-age female, tearful during today's interval Speech:    Speech is normal; fluent and spontaneous with normal comprehension.  Cognition:     Orientation to time, place and person     Normal recent and remote memory     Normal Attention span and concentration     Normal Language, naming, repeating,spontaneous speech     Fund of knowledge   CRANIAL NERVES: CN II: Visual fields are full to confrontation. Fundoscopic exam is normal with sharp discs and no vascular changes. Pupils are round equal and briskly reactive to light. CN III, IV, VI: extraocular movement are normal. No ptosis. CN V: Facial sensation is intact to pinprick in all 3 divisions bilaterally. Corneal responses are intact.  CN VII: Face is symmetric with normal eye closure and smile. CN VIII: Hearing is normal to rubbing fingers CN IX, X: Palate elevates symmetrically. Phonation is normal. CN XI: Head turning and shoulder shrug are intact CN XII: Tongue is midline with normal movements and no atrophy.  MOTOR: There is no pronator drift of out-stretched arms. Muscle bulk and tone are normal. Muscle strength is normal.  REFLEXES: Reflexes are 2+ and symmetric at the biceps, triceps, knees, and ankles. Plantar responses are flexor.  SENSORY: Intact to light touch, pinprick,  positional sensation and vibratory sensation are intact in fingers and toes.  COORDINATION: Rapid alternating movements and fine finger movements are intact. There is no dysmetria on finger-to-nose and heel-knee-shin.    GAIT/STANCE: Posture is normal. Gait is steady with normal steps, base, arm swing, and turning. Heel and toe walking are normal. Tandem gait is normal.  Romberg is absent.   DIAGNOSTIC DATA (LABS, IMAGING, TESTING) - I reviewed patient records, labs, notes, testing and imaging myself where available.   ASSESSMENT AND PLAN  Meagan Thornton is a 40 y.o. female   Constellation of symptoms, right-sided neck pain, radiating pain to right arm and shoulder Low back pain, radiating pain to right hip, right leg, Right arm and leg paresthesia, subjective weakness  After discuss with patient, she wants extensive evaluations.  Proceed  with MRI of brain, cervical spine, lumbar spine  EMG nerve conduction study,  Laboratory evaluation today Polypharmacy treatment  She is already on polypharmacy, multiple supplements,   onucynta 75 mg 3 times a day, methocarbamol 500 mg 4 times a day, Cymbalta 60 mg daily,   Marcial Pacas, M.D. Ph.D.  [facility] [address], [place], [place] [address] Ph: [phone] Fax: [phone]  CC: Suella Broad, MD, Chesley Noon, MD

## 2016-05-06 LAB — COMPREHENSIVE METABOLIC PANEL
ALK PHOS: 60 IU/L (ref 39–117)
ALT: 25 IU/L (ref 0–32)
AST: 34 IU/L (ref 0–40)
Albumin/Globulin Ratio: 2.2 (ref 1.2–2.2)
Albumin: 4.6 g/dL (ref 3.5–5.5)
BILIRUBIN TOTAL: 0.3 mg/dL (ref 0.0–1.2)
BUN/Creatinine Ratio: 13 (ref 9–23)
BUN: 11 mg/dL (ref 6–24)
CHLORIDE: 102 mmol/L (ref 96–106)
CO2: 26 mmol/L (ref 18–29)
CREATININE: 0.82 mg/dL (ref 0.57–1.00)
Calcium: 9.3 mg/dL (ref 8.7–10.2)
GFR calc Af Amer: 104 mL/min/{1.73_m2} (ref >59)
GFR calc non Af Amer: 90 mL/min/{1.73_m2} (ref >59)
GLOBULIN, TOTAL: 2.1 g/dL (ref 1.5–4.5)
GLUCOSE: 89 mg/dL (ref 65–99)
Potassium: 4 mmol/L (ref 3.5–5.2)
SODIUM: 142 mmol/L (ref 134–144)
Total Protein: 6.7 g/dL (ref 6.0–8.5)

## 2016-05-06 LAB — THYROID PANEL WITH TSH
FREE THYROXINE INDEX: 1.5 (ref 1.2–4.9)
T3 UPTAKE RATIO: 27 % (ref 24–39)
T4, Total: 5.7 ug/dL (ref 4.5–12.0)
TSH: 0.747 u[IU]/mL (ref 0.450–4.500)

## 2016-05-06 LAB — ANA W/REFLEX IF POSITIVE
ANA: POSITIVE — AB
Centromere Ab Screen: <0.2 AI (ref 0.0–0.9)
Chromatin Ab SerPl-aCnc: <0.2 AI (ref 0.0–0.9)
ENA RNP Ab: 1.2 AI — ABNORMAL HIGH (ref 0.0–0.9)
ENA SM Ab Ser-aCnc: <0.2 AI (ref 0.0–0.9)
Scleroderma SCL-70: <0.2 AI (ref 0.0–0.9)
dsDNA Ab: 2 IU/mL (ref 0–9)

## 2016-05-06 LAB — CBC
HEMATOCRIT: 39.3 % (ref 34.0–46.6)
Hemoglobin: 12.5 g/dL (ref 11.1–15.9)
MCH: 26.6 pg (ref 26.6–33.0)
MCHC: 31.8 g/dL (ref 31.5–35.7)
MCV: 84 fL (ref 79–97)
Platelets: 312 10*3/uL (ref 150–379)
RBC: 4.7 x10E6/uL (ref 3.77–5.28)
RDW: 15.5 % — AB (ref 12.3–15.4)
WBC: 4.1 10*3/uL (ref 3.4–10.8)

## 2016-05-06 LAB — CK: CK TOTAL: 296 U/L — AB (ref 24–173)

## 2016-05-06 LAB — VITAMIN D 25 HYDROXY (VIT D DEFICIENCY, FRACTURES): VIT D 25 HYDROXY: 54.3 ng/mL (ref 30.0–100.0)

## 2016-05-06 LAB — SEDIMENTATION RATE: SED RATE: 2 mm/h (ref 0–32)

## 2016-05-06 LAB — RPR: RPR Ser Ql: NONREACTIVE

## 2016-05-06 LAB — C-REACTIVE PROTEIN: CRP: <0.3 mg/L (ref 0.0–4.9)

## 2016-05-06 LAB — VITAMIN B12: Vitamin B-12: 1017 pg/mL — ABNORMAL HIGH (ref 211–946)

## 2016-05-06 LAB — FOLATE: Folate: >20 ng/mL (ref >3.0)

## 2016-05-08 ENCOUNTER — Ambulatory Visit (INDEPENDENT_AMBULATORY_CARE_PROVIDER_SITE_OTHER): Payer: Managed Care, Other (non HMO) | Admitting: Neurology

## 2016-05-08 ENCOUNTER — Encounter (INDEPENDENT_AMBULATORY_CARE_PROVIDER_SITE_OTHER): Payer: Self-pay | Admitting: Neurology

## 2016-05-08 DIAGNOSIS — M5441 Lumbago with sciatica, right side: Secondary | ICD-10-CM | POA: Diagnosis not present

## 2016-05-08 DIAGNOSIS — R202 Paresthesia of skin: Secondary | ICD-10-CM | POA: Diagnosis not present

## 2016-05-08 DIAGNOSIS — M79609 Pain in unspecified limb: Secondary | ICD-10-CM

## 2016-05-08 DIAGNOSIS — G8929 Other chronic pain: Secondary | ICD-10-CM

## 2016-05-08 DIAGNOSIS — Z0289 Encounter for other administrative examinations: Secondary | ICD-10-CM

## 2016-05-08 DIAGNOSIS — M542 Cervicalgia: Secondary | ICD-10-CM

## 2016-05-08 IMAGING — US US THYROID BIOPSY
1 series · 12 of 12 positions shown · non-contrast
Comparison: Thyroid ultrasound - 12/12/2013

MEDICATIONS:
None

COMPLICATIONS:
None immediate

INDICATION: History of partial resection of the right and left lobes of the
thyroid, now with indeterminate nodule within the superior aspect of
the left lobe of the thyroid.

EXAM:
ULTRASOUND GUIDED FINE NEEDLE ASPIRATION OF INDETERMINATE THYROID
NODULE
TECHNIQUE: Informed written consent was obtained from the patient after a
discussion of the risks, benefits and alternatives to treatment.
Questions regarding the procedure were encouraged and answered. A
timeout was performed prior to the initiation of the procedure.

[Series 1: us thyroid biopsy · 0.06mm/px · 12 acquisitions, 12 frames shown]
[im 1/12]
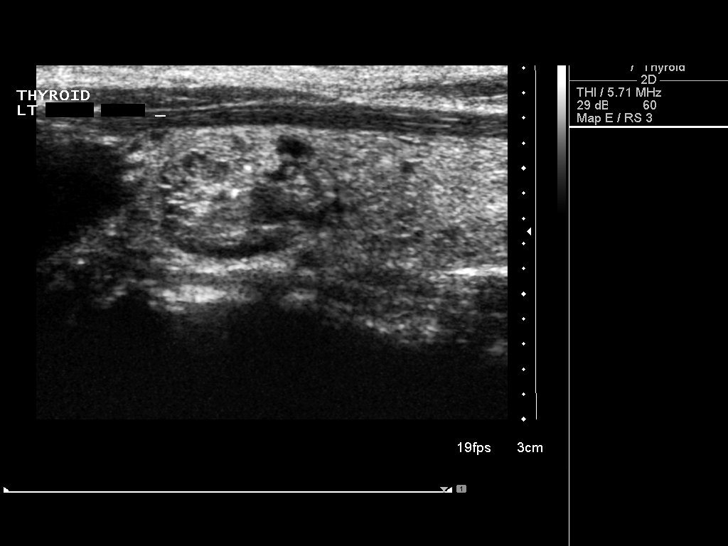
[im 2/12]
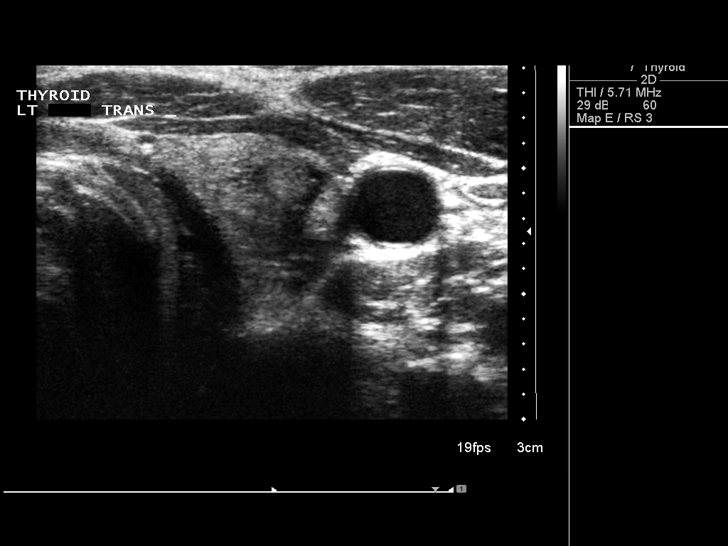
[im 3/12]
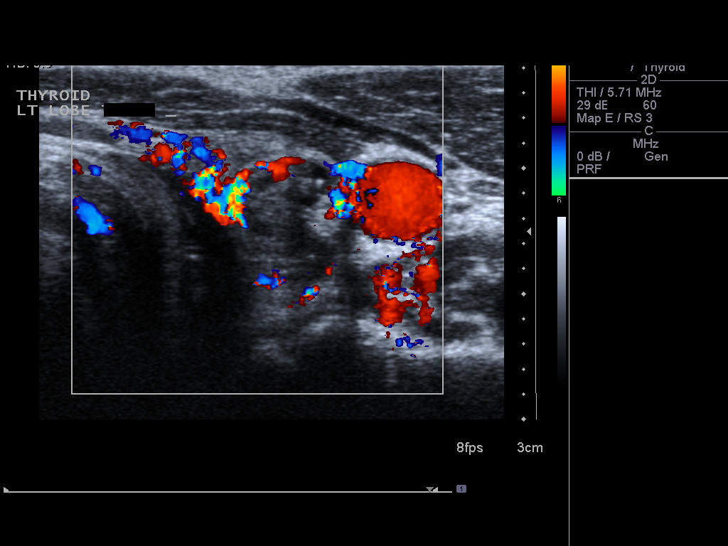
[im 4/12]
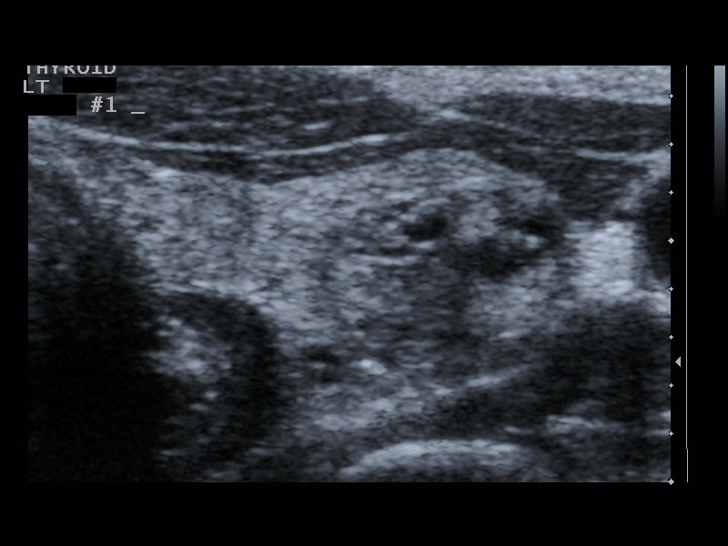
[im 5/12]
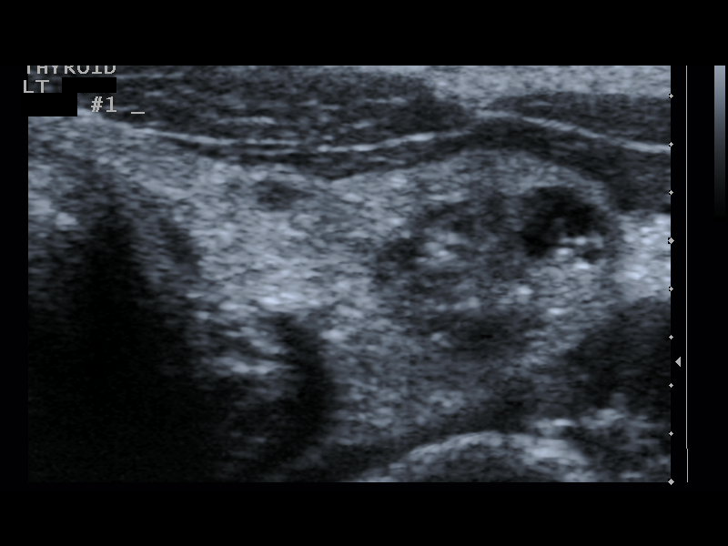
[im 6/12]
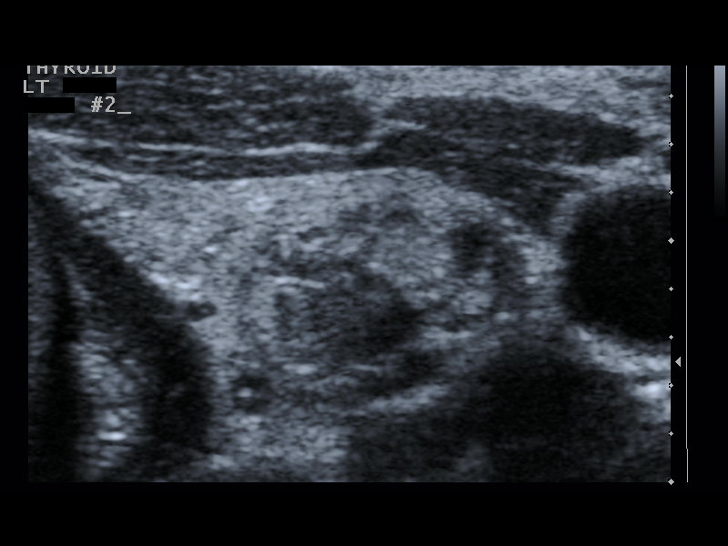
[im 7/12]
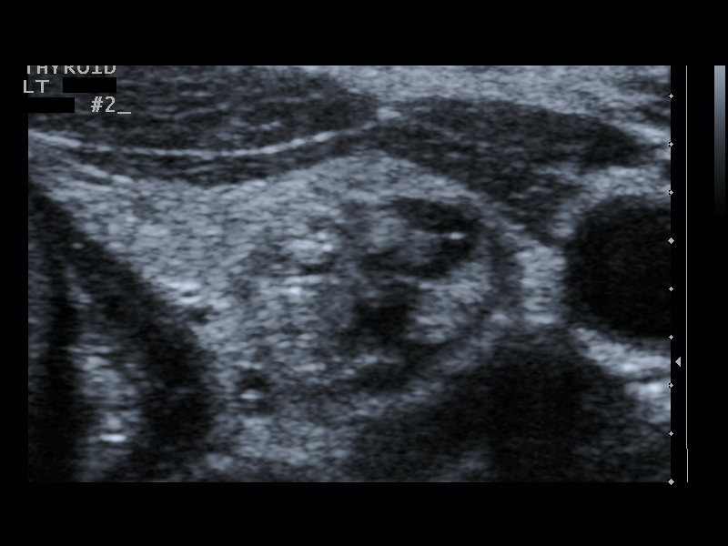
[im 8/12]
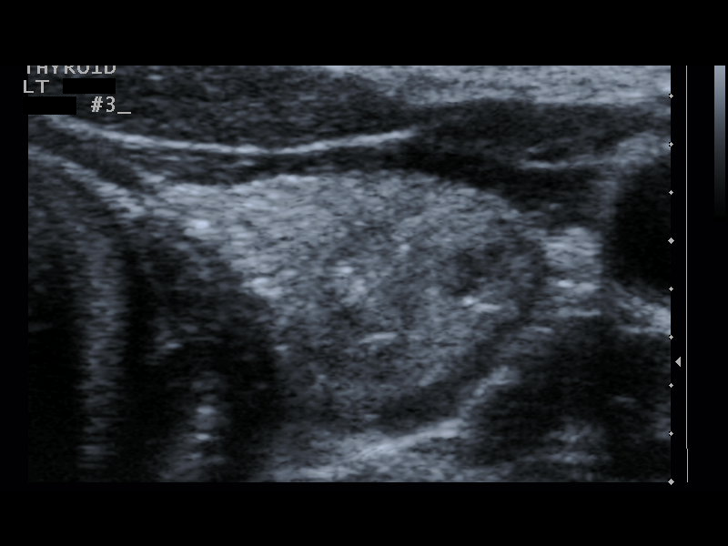
[im 9/12]
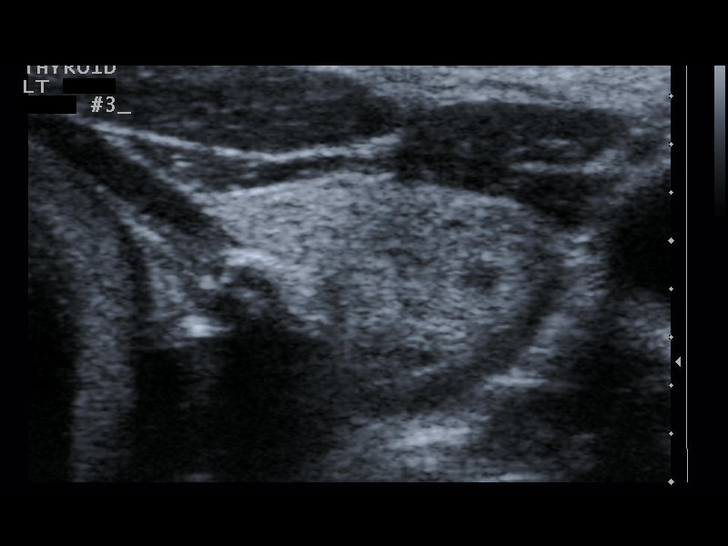
[im 10/12]
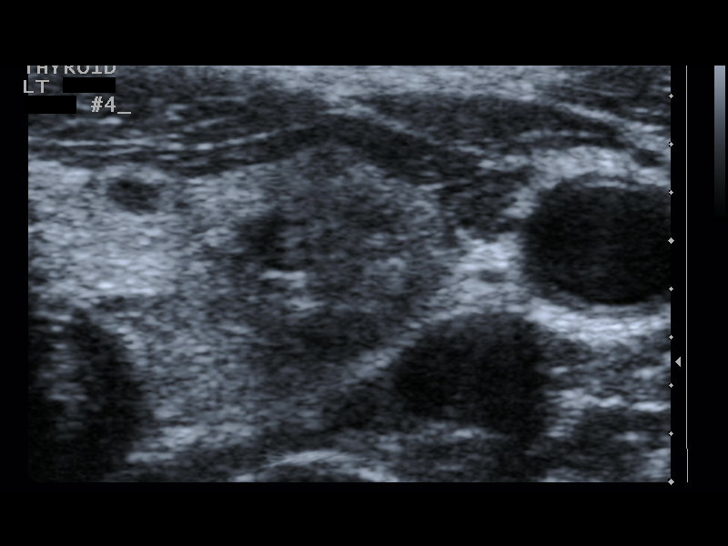
[im 11/12]
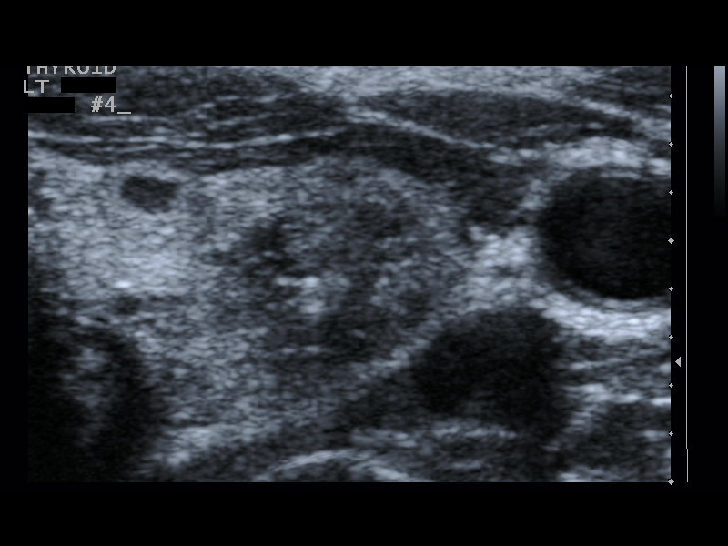
[im 12/12]
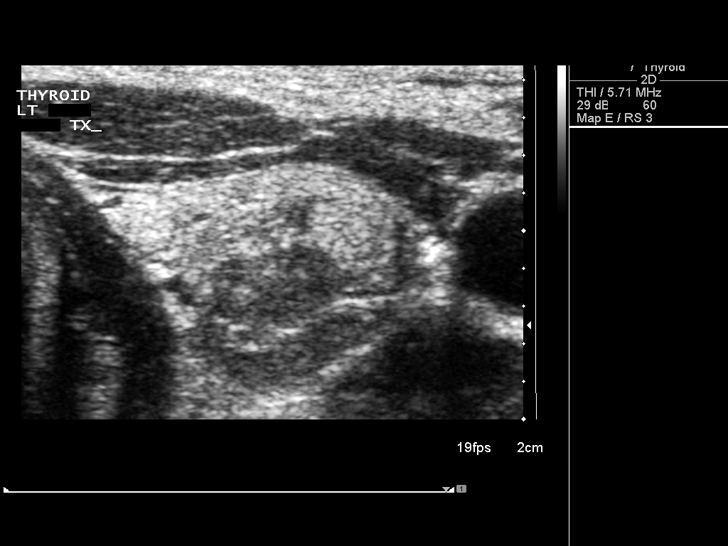

[12 of 12 positions shown; findings below may reference images not displayed]

Pre-procedural ultrasound scanning demonstrated unchanged appearance
of the dominant mixed echogenic approximately 1.5 cm nodule within
the superior aspect of the residual left lobe of the thyroid.

The procedure was planned. The neck was prepped in the usual sterile
fashion, and a sterile drape was applied covering the operative
field. A timeout was performed prior to the initiation of the
procedure. Local anesthesia was provided with 1% lidocaine.

Under direct ultrasound guidance, 4 FNA biopsies were performed of
the dominant nodule within superior aspect of the left lobe of the
thyroid with a 25 gauge needle. The samples were prepared and
submitted to pathology.

Limited post procedural scanning was negative for hematoma or
additional complication. Dressings were placed. The patient
tolerated the above procedures procedure well without immediate
postprocedural complication.
IMPRESSION: Technically successful ultrasound guided fine needle aspiration of
dominant approximately 1.5 cm complex nodule within the superior
aspect of the left lobe of the thyroid

## 2016-05-08 NOTE — Procedures (Signed)
   NCS (NERVE CONDUCTION STUDY) WITH EMG (ELECTROMYOGRAPHY) REPORT   STUDY DATE: May 08 2016 PATIENT NAME: Meagan Thornton DOB: May 20, 1976 MRN: [id number]    TECHNOLOGIST: Laretta Alstrom ELECTROMYOGRAPHER: Marcial Pacas M.D.  CLINICAL INFORMATION:  40 years old right-handed female, with history of gradual onset declining functional status, whole body achy pain, bilateral hands paresthesia.  FINDINGS: NERVE CONDUCTION STUDY: Bilateral median sensory response showed mild to moderately prolonged peak latency, right worse than left, also with mildly decreased snap amplitude on the right side. Bilateral ulnar sensory responses were within normal limits. Bilateral median mixed response showed prolonged peak latency, especially in comparison with bilateral ulnar mixed response.  Bilateral median motor responses showed mild to moderately prolonged distal latency, with preserved snap amplitude, and conduction velocity.  Bilateral ulnar motor responses were normal.  Right superficial peroneal, sural sensory responses were normal. Right peroneal to EDB, and tibial motor responses were normal.  Bilateral tibial H reflexes were present and symmetric.  NEEDLE ELECTROMYOGRAPHY: Selective needle examinations were performed at right upper extremity muscles, right cervical paraspinal muscles, right lower extremity muscles and right lumbosacral paraspinal muscles.  Needle examination of right first dorsal interossei, extensor digitorum communis, biceps, triceps, deltoid was normal.  There was no spontaneous activity at right cervical paraspinal muscles, right C5, 6, 7.  Needle examination of right tibialis anterior, tibialis posterior, medial gastrocnemius, peroneal longus, vastus lateralis was normal.  There was no spontaneous activity at right lumbosacral paraspinal muscles, right L4-5 S1.  IMPRESSION:   This is a mild abnormal study. There is electrodiagnostic evidence of moderate bilateral  median neuropathy across the wrist, consistent with moderate bilateral carpal tunnel syndromes. There is no evidence of right cervical radiculopathy or right lumbosacral radiculopathy.    INTERPRETING PHYSICIAN:   Marcial Pacas M.D. Ph.D. [facility] [address], [place], [place] [address] [phone]

## 2016-05-20 ENCOUNTER — Ambulatory Visit
Admission: RE | Admit: 2016-05-20 | Discharge: 2016-05-20 | Disposition: A | Discharge status: Home / Self Care | Payer: Managed Care, Other (non HMO) | Source: Ambulatory Visit | Attending: Neurology | Admitting: Neurology

## 2016-05-20 DIAGNOSIS — R202 Paresthesia of skin: Secondary | ICD-10-CM

## 2016-05-20 DIAGNOSIS — M79609 Pain in unspecified limb: Secondary | ICD-10-CM

## 2016-05-20 DIAGNOSIS — G8929 Other chronic pain: Secondary | ICD-10-CM

## 2016-05-20 DIAGNOSIS — M542 Cervicalgia: Secondary | ICD-10-CM

## 2016-05-20 DIAGNOSIS — M5441 Lumbago with sciatica, right side: Secondary | ICD-10-CM

## 2016-05-21 ENCOUNTER — Other Ambulatory Visit: Payer: Managed Care, Other (non HMO)

## 2016-05-25 ENCOUNTER — Telehealth: Payer: Self-pay | Admitting: *Deleted

## 2016-05-25 NOTE — Telephone Encounter (Signed)
Patient would like lab results.

## 2016-05-25 NOTE — Telephone Encounter (Addendum)
Please call patient, extensive laboratory evaluation showed normal CBC, CMP, TSH, RPR, W73, folic acid, ESR, C-reactive protein,  There is only mild abnormality on elevated CPK 296 of unknown clinical significance, the other mild abnormalities positive ANA, ENA-RNP antibody with low titer, it has unknown clinical significance.

## 2016-05-26 NOTE — Telephone Encounter (Signed)
Pt aware of lab results and will discuss in more detail with Dr. Krista Blue at her 06/02/16 appt.

## 2016-05-26 NOTE — Addendum Note (Signed)
Addended by: Noberto Retort C on: 05/26/2016 11:28 AM   Modules accepted: Orders

## 2016-06-02 ENCOUNTER — Encounter: Payer: Self-pay | Admitting: Neurology

## 2016-06-02 ENCOUNTER — Ambulatory Visit (INDEPENDENT_AMBULATORY_CARE_PROVIDER_SITE_OTHER): Payer: Managed Care, Other (non HMO) | Admitting: Neurology

## 2016-06-02 VITALS — BP 116/71 | HR 103 | Ht 61.75 in | Wt 126.0 lb

## 2016-06-02 DIAGNOSIS — R202 Paresthesia of skin: Secondary | ICD-10-CM | POA: Diagnosis not present

## 2016-06-02 DIAGNOSIS — M542 Cervicalgia: Secondary | ICD-10-CM | POA: Diagnosis not present

## 2016-06-02 DIAGNOSIS — M79609 Pain in unspecified limb: Secondary | ICD-10-CM | POA: Diagnosis not present

## 2016-06-02 MED ORDER — VENLAFAXINE HCL ER 37.5 MG PO CP24: 37.5000 mg | ORAL_CAPSULE | Freq: Every day | ORAL | 11 refills | Status: AC

## 2016-06-02 NOTE — Progress Notes (Signed)
PATIENT: Meagan Thornton DOB: [birth year]  Chief Complaint  Patient presents with  . Chronic Muscle Pain/Numbness    She would like to review her lab, MRI and NCV/EMG results.     HISTORICAL  Meagan Thornton is a 40 years old right-handed female, seen in refer by her pain management Dr. Suella Broad, for evaluation of whole body achy pain, her primary care physician is Dr. Chesley Noon, initial evaluation was on May 05 2016  She is tearful during today's interview, she has a lot of family stress, she has 2 sons at age 68, and 41 years old, her 70-year-old son carried a diagnosis of CHARGE syndrome, needing a lot of care, he had trach, and feeding tube.  Since 2013, the patient complains of gradual worsening functional status, whole body achy pain, she used to be a Physiological scientist, was able to run half marathon, now she complains of whole body achy pain, especially her right neck, radiating pain to her right shoulder right arm, right side low back pain, radiating pain to her right hip, right leg, subjective right side paresthesia, weakness.  She can still run 4 miles but feel exhausted afterwards, right pelvic, chest area achy pain, multiple tender spots throughout her body, over the years, she has tried yoga, dry needling, pain management with limited help, she is currently on polypharmacy, including methocarbamol 500 mg 4 times a day, Nucynta 75 mg 3 times a day,, Valium as needed, Cymbalta 60 mg daily, multiple over counter supplement, she could not tolerate Lyrica, make her drowsy   UPDATE Jun 02 2016: We have reviewed her laboratory evaluation, normal vitamin 123456, vitamin D, folic acid, negative RPR, normal CMP, CBC, TSH, the only abnormalities mild elevated CPK but only 296, positive ANA, ENT antibody was mildly positive, also reviewed laboratory from outside clinic dated May 18 2016, normal thyroid functional test, hemoglobin 12 point 6,  We had personally reviewed MRI  of the brain that was normal, MRI of the cervical, and lumbar spine showed degenerative disc disease but no significant foraminal canal stenosis. There was also incidental finding of left renal cyst, which patient has know been there for a while,  EMG nerve conduction study in October 2017 showed evidence of moderate bilateral carpal tunnel syndromes.   REVIEW OF SYSTEMS: Full 14 system review of systems performed and notable only for as above  ALLERGIES: Allergies  Allergen Reactions  . Hydrocodone-Acetaminophen     Other reaction(s): VOMITING  . Oxycodone-Acetaminophen Nausea And Vomiting    HOME MEDICATIONS: Current Outpatient Prescriptions  Medication Sig Dispense Refill  . acyclovir (ZOVIRAX) 400 MG tablet Take 400 mg by mouth daily as needed.     . Ascorbic Acid (VITAMIN C) 1000 MG tablet Take 1,000 mg by mouth daily.    . Aspirin-Acetaminophen-Caffeine (EXCEDRIN MIGRAINE PO) Take by mouth as needed.    . B Complex Vitamins (VITAMIN B COMPLEX PO) Take by mouth daily.    . Biotin 10000 MCG TABS Take by mouth daily.    . Cholecalciferol (VITAMIN D3) 5000 units TABS Take by mouth daily.    . Coenzyme Q10 (CO Q 10) 100 MG CAPS Take by mouth daily.    . diazepam (VALIUM) 10 MG tablet Take 10 mg by mouth every 6 (six) hours as needed for anxiety.    . DULoxetine (CYMBALTA) 60 MG capsule 60 mg daily.    Marland Kitchen FERROUS SULFATE PO Take 18 mg by mouth. Take four tablets daily.    Marland Kitchen  fluconazole (DIFLUCAN) 150 MG tablet Take 150 mg by mouth daily as needed.    . Licorice, Glycyrrhiza glabra, (LICORICE ROOT PO) Take by mouth. Chew tablet before each meal (using about 5 times per day).    Marland Kitchen MAGNESIUM PO Take 200 mg by mouth 2 (two) times daily.    . methocarbamol (ROBAXIN) 500 MG tablet Take 500 mg by mouth 4 (four) times daily.    . Multiple Vitamins-Minerals (WOMENS MULTIVITAMIN PO) Take by mouth daily.    Marland Kitchen NATURE-THROID 65 MG tablet 2 (two) times daily.    Marland Kitchen PEPPERMINT OIL PO Take by mouth 3  (three) times daily.    . Pregnenolone Micronized POWD 75 mg. Takes 2 tablets daily.    Marland Kitchen PROGESTERONE, VAGINAL, 8 % GEL Place vaginally. Apply to upper arms or upper inner thigh BID on menstrual cycle days 10-28.    . Tapentadol HCl (NUCYNTA) 75 MG TABS Take 75 mg by mouth 3 (three) times daily as needed.    Marland Kitchen UNABLE TO FIND 250 mg 3 (three) times daily. Relora    . UNABLE TO FIND daily as needed. Super Cleanse - take two tablets as needed.    Marland Kitchen UNABLE TO FIND as needed. Gluten Ease    . UNABLE TO FIND Digestion Enhancement Enzymes - one VeganCap before each meal (taking 5-6 times per day).    Marland Kitchen UNABLE TO FIND Enzymatic "Aunt Flo Menstrual Formula" - Take 1-2 tablets as needed.    Marland Kitchen UNABLE TO FIND Colostrum-LD Liposomal Delivery Powder - Takes 1 teaspoon twice daily.     No current facility-administered medications for this visit.     PAST MEDICAL HISTORY: Past Medical History:  Diagnosis Date  . Anxiety   . Basal cell carcinoma   . Chronic pain syndrome   . Fibromyalgia   . Migraine     PAST SURGICAL HISTORY: Past Surgical History:  Procedure Laterality Date  . CESAREAN SECTION  2008  . SHOULDER ARTHROSCOPY W/ SUPERIOR LABRAL ANTERIOR POSTERIOR LESION REPAIR Right 2015  . THYROIDECTOMY, PARTIAL  2004, 2012  . UMBILICAL HERNIA REPAIR  2012, 2013    FAMILY HISTORY: Family History  Problem Relation Age of Onset  . Muscular dystrophy Mother   . Heart failure Mother   . Breast cancer Maternal Grandmother     SOCIAL HISTORY:  Social History   Social History  . Marital status: Married    Spouse name: N/A  . Number of children: 2  . Years of education: Bachelors   Occupational History  . Homemaker    Social History Main Topics  . Smoking status: Former Research scientist (life sciences)  . Smokeless tobacco: Never Used  . Alcohol use Yes     Comment: socially - couple times per year  . Drug use: No  . Sexual activity: Not on file   Other Topics Concern  . Not on file   Social History  Narrative   Lives at home with husband and children.   Married, 2 children   Right handed   Bachelor   2 cups caffeine daily     PHYSICAL EXAM   Vitals:   06/02/16 0942  BP: 116/71  Pulse: (!) 103  Weight: 126 lb (57.2 kg)  Height: 5' 1.75" (1.568 m)    Not recorded      Body mass index is 23.23 kg/m.  PHYSICAL EXAMNIATION:  Gen: NAD, conversant, well nourised, obese, well groomed  Cardiovascular: Regular rate rhythm, no peripheral edema, warm, nontender. Eyes: Conjunctivae clear without exudates or hemorrhage Neck: Supple, no carotid bruise. Pulmonary: Clear to auscultation bilaterally   NEUROLOGICAL EXAM:  MENTAL STATUS:Stress looking middle-age female, tearful during today's interval Speech:    Speech is normal; fluent and spontaneous with normal comprehension.  Cognition:     Orientation to time, place and person     Normal recent and remote memory     Normal Attention span and concentration     Normal Language, naming, repeating,spontaneous speech     Fund of knowledge   CRANIAL NERVES: CN II: Visual fields are full to confrontation. Fundoscopic exam is normal with sharp discs and no vascular changes. Pupils are round equal and briskly reactive to light. CN III, IV, VI: extraocular movement are normal. No ptosis. CN V: Facial sensation is intact to pinprick in all 3 divisions bilaterally. Corneal responses are intact.  CN VII: Face is symmetric with normal eye closure and smile. CN VIII: Hearing is normal to rubbing fingers CN IX, X: Palate elevates symmetrically. Phonation is normal. CN XI: Head turning and shoulder shrug are intact CN XII: Tongue is midline with normal movements and no atrophy.  MOTOR: There is no pronator drift of out-stretched arms. Muscle bulk and tone are normal. Muscle strength is normal.  REFLEXES: Reflexes are 2+ and symmetric at the biceps, triceps, knees, and ankles. Plantar responses are  flexor.  SENSORY: Intact to light touch, pinprick, positional sensation and vibratory sensation are intact in fingers and toes.  COORDINATION: Rapid alternating movements and fine finger movements are intact. There is no dysmetria on finger-to-nose and heel-knee-shin.    GAIT/STANCE: Posture is normal. Gait is steady with normal steps, base, arm swing, and turning. Heel and toe walking are normal. Tandem gait is normal.  Romberg is absent.   DIAGNOSTIC DATA (LABS, IMAGING, TESTING) - I reviewed patient records, labs, notes, testing and imaging myself where available.   ASSESSMENT AND PLAN  Meagan Thornton is a 40 y.o. female   Constellation of symptoms, whole body achy pain, sensitive to touch, Extensive neurological evaluation detailed above, failed to demonstrate neuromuscular etiology  Slight elevated CPK of 296 of unknown clinical significance, does not indicate intrinsic muscle disease. In addition EMG showed no evidence of inflammatory myopathy.  She is already on polypharmacy, multiple supplements,  I have suggested Effexor 37.5 daily, continue follow-up with primary care physician,  No further neurological evaluation is needed    Marcial Pacas, M.D. Ph.D.  [facility] [address] [place], [place] [address] Ph: [phone] Fax: [phone]  CC: Suella Broad, MD, Chesley Noon, MD

## 2017-05-04 ENCOUNTER — Other Ambulatory Visit: Payer: Self-pay | Admitting: Nurse Practitioner

## 2017-05-04 DIAGNOSIS — E039 Hypothyroidism, unspecified: Secondary | ICD-10-CM

## 2017-05-11 ENCOUNTER — Other Ambulatory Visit: Payer: Managed Care, Other (non HMO)

## 2017-05-18 ENCOUNTER — Other Ambulatory Visit: Payer: Self-pay

## 2017-05-27 ENCOUNTER — Ambulatory Visit
Admission: RE | Admit: 2017-05-27 | Discharge: 2017-05-27 | Disposition: A | Discharge status: Home / Self Care | Payer: Self-pay | Source: Ambulatory Visit | Attending: Nurse Practitioner | Admitting: Nurse Practitioner

## 2017-05-27 DIAGNOSIS — E039 Hypothyroidism, unspecified: Secondary | ICD-10-CM

## 2017-07-08 ENCOUNTER — Other Ambulatory Visit: Payer: Self-pay | Admitting: Neurology

## 2017-07-20 ENCOUNTER — Other Ambulatory Visit: Payer: Self-pay | Admitting: Neurology

## 2018-05-17 IMAGING — US US THYROID
1 series · 13 of 25 positions shown · non-contrast
Comparison: 12/12/2013

CLINICAL DATA: Hypothyroidism. History of previous partial
thyroidectomy. Previous FNA biopsy of superior left nodule
04/24/2014

EXAM:
THYROID ULTRASOUND
TECHNIQUE: Ultrasound examination of the thyroid gland and adjacent soft
tissues was performed.

[Series 1: us thyroid · 0.03mm/px · 13 of 52 slices shown]
[im 1/52]
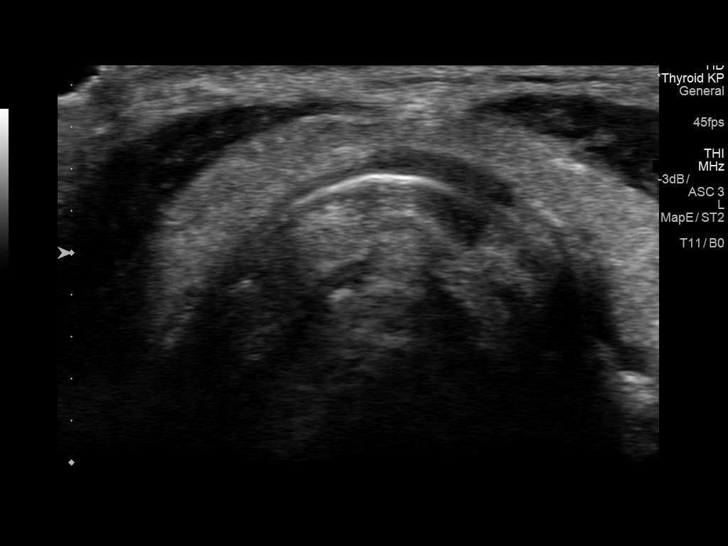
[im 5/52]
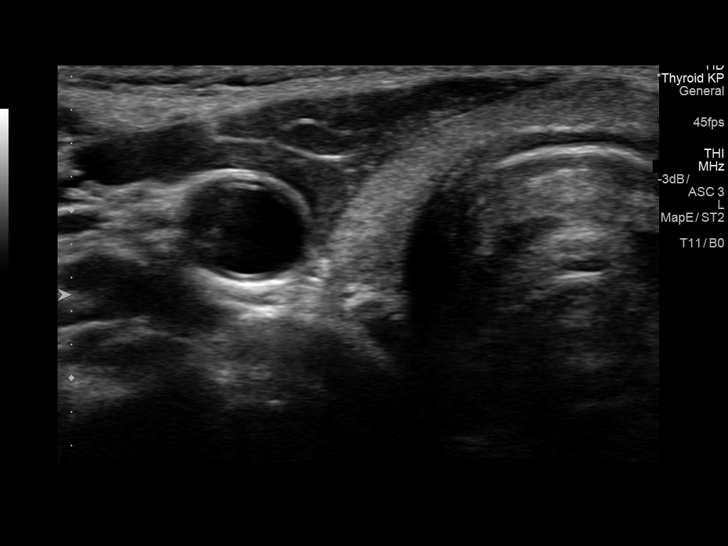
[im 9/52]
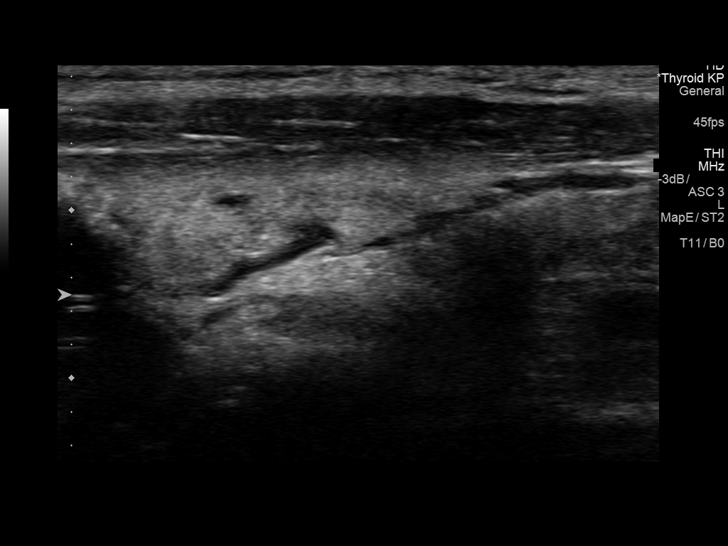
[im 13/52]
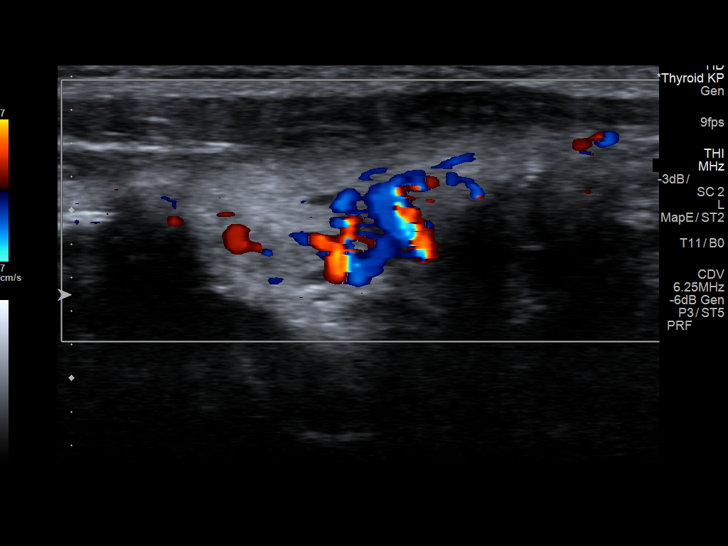
[im 18/52]
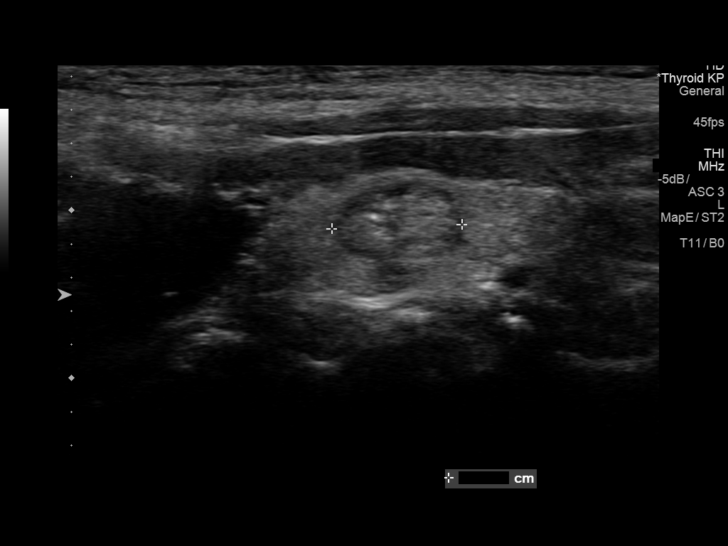
[im 22/52]
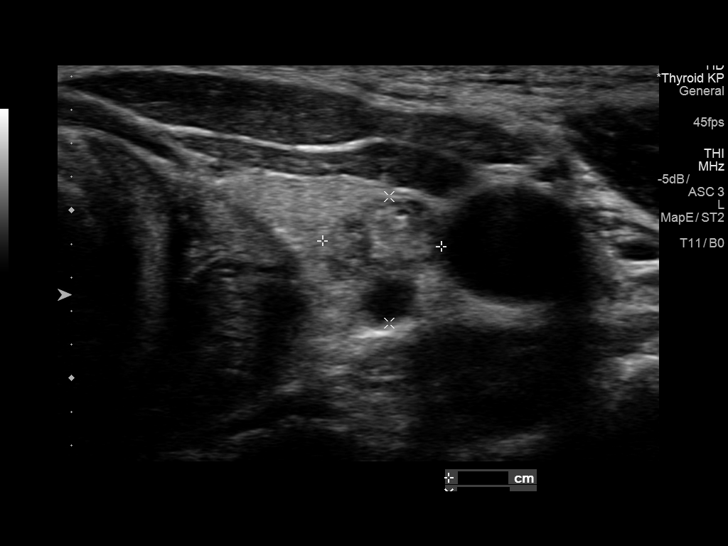
[im 26/52]
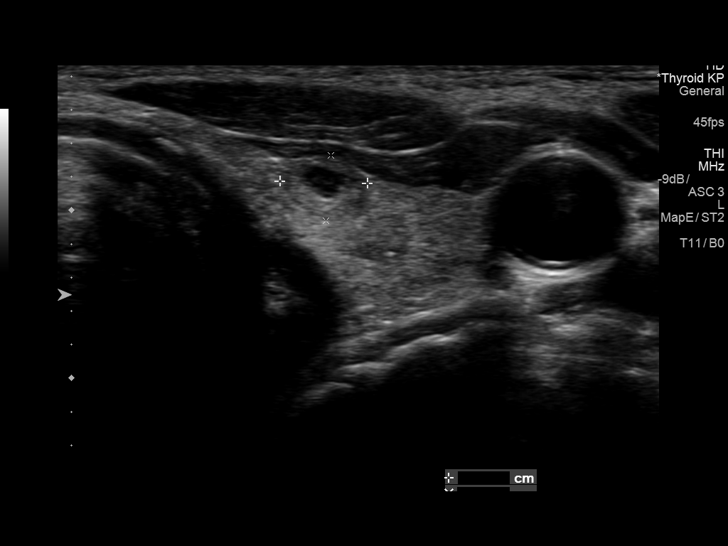
[im 30/52]
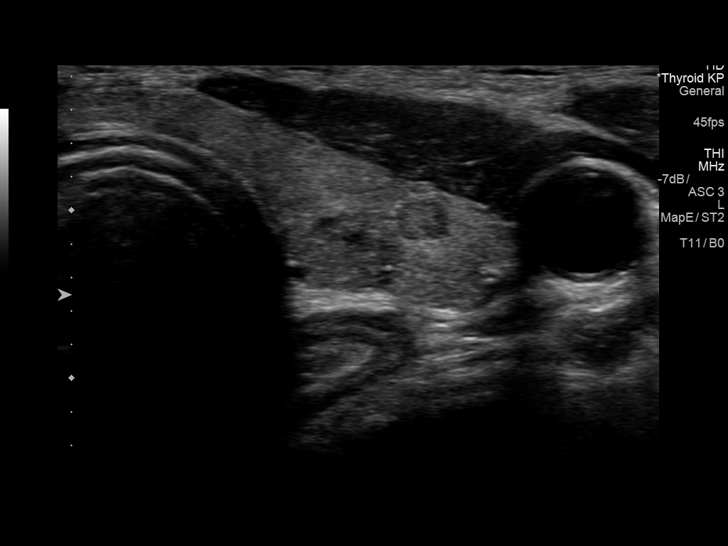
[im 35/52]
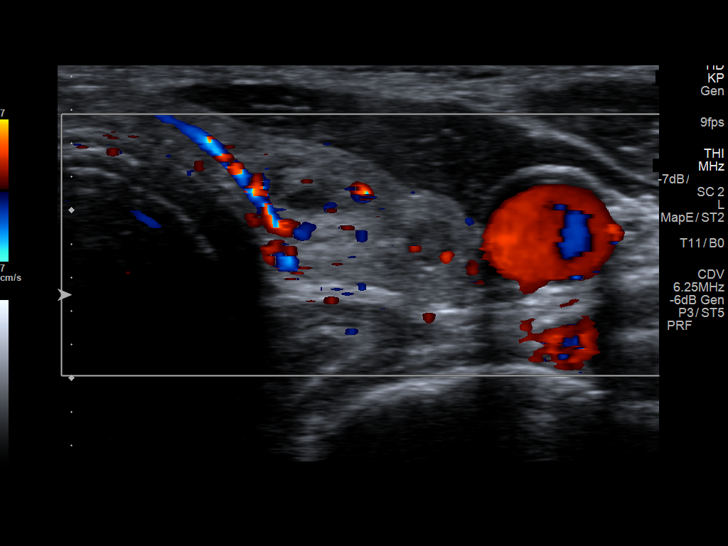
[im 39/52]
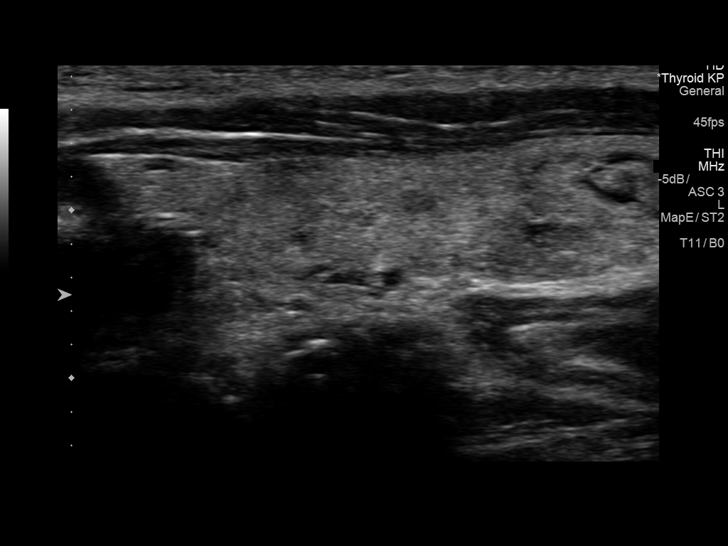
[im 43/52]
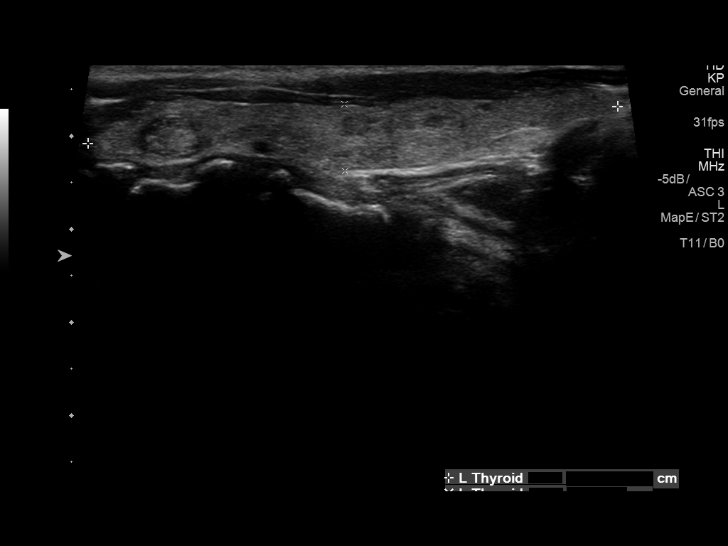
[im 47/52]
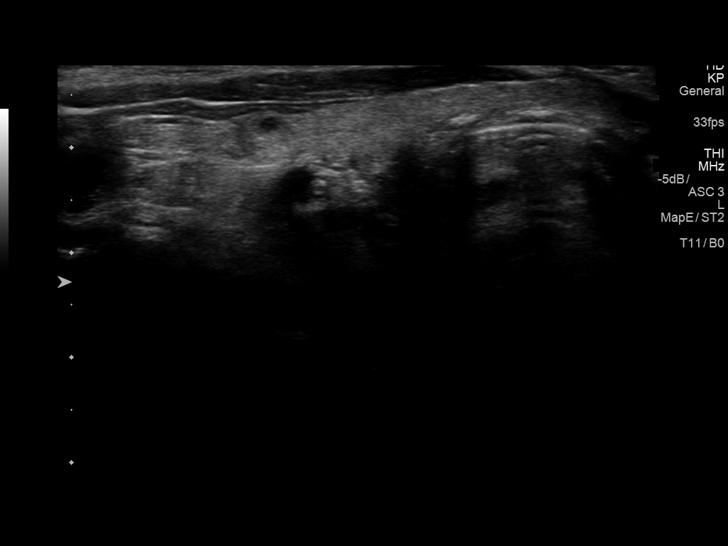
[im 52/52]
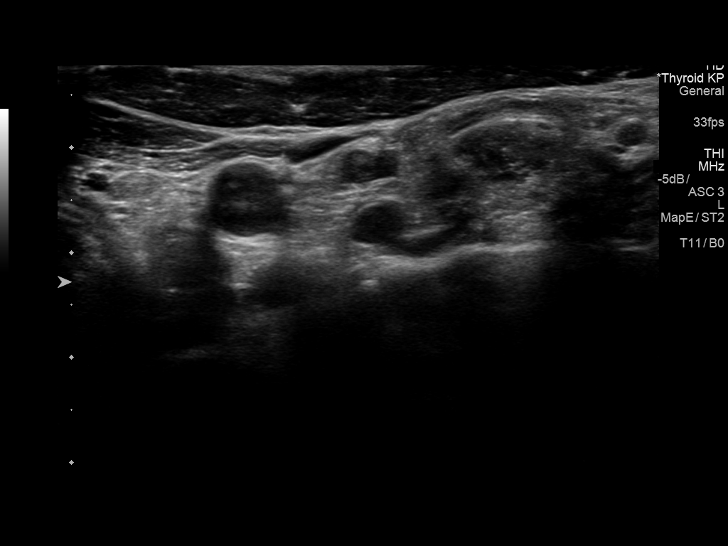

[13 of 25 positions shown; findings below may reference images not displayed]

FINDINGS: Parenchymal Echotexture: Mildly heterogenous

Isthmus: 0.2 cm thickness, previously

Right lobe: 2.5 x 0.6 x 0.5 cm, previously 2.4 x 0.7 x

Left lobe: 5.7 x 0.7 x 1.6 cm, previously 5.5 x 0.9 x

_________________________________________________________

Estimated total number of nodules >/= 1 cm: 0

Number of spongiform nodules >/=  2 cm not described below (TR1): 0

Number of mixed cystic and solid nodules >/= 1.5 cm not described
below (TR2): 0

_________________________________________________________

0.8 x 0.7 cm isoechoic nodule with punctate echogenic focus,
superior left, previously 1.5 x 0.9 x 1; this was previously
biopsied.

0.9 x 0.5 x 0.7 cm mixed solid/cystic nodule, inferior left,
previously 1.1 x 0.5 x 0.8.

Additional scattered hypoechoic/cystic regions less than 0.5 cm.
IMPRESSION: 1. Stable thyroid with small bilateral nodules. None meets criteria
for biopsy or dedicated imaging follow-up.

The above is in keeping with the ACR TI-RADS recommendations - [HOSPITAL] 1547;[DATE].

## 2018-08-25 ENCOUNTER — Other Ambulatory Visit: Payer: Self-pay | Admitting: Chiropractic Medicine

## 2018-08-25 DIAGNOSIS — R1010 Upper abdominal pain, unspecified: Secondary | ICD-10-CM

## 2019-08-08 ENCOUNTER — Ambulatory Visit: Payer: Commercial Managed Care - PPO | Attending: Internal Medicine

## 2019-08-08 DIAGNOSIS — Z20822 Contact with and (suspected) exposure to covid-19: Secondary | ICD-10-CM

## 2019-08-10 LAB — NOVEL CORONAVIRUS, NAA: SARS-CoV-2, NAA: NOT DETECTED

## 2022-04-30 ENCOUNTER — Ambulatory Visit
Admission: RE | Admit: 2022-04-30 | Discharge: 2022-04-30 | Disposition: A | Discharge status: Home / Self Care | Payer: Commercial Managed Care - PPO | Source: Ambulatory Visit | Attending: Orthopaedic Surgery | Admitting: Orthopaedic Surgery

## 2022-04-30 ENCOUNTER — Other Ambulatory Visit: Payer: Self-pay | Admitting: Orthopaedic Surgery

## 2022-04-30 DIAGNOSIS — T148XXA Other injury of unspecified body region, initial encounter: Secondary | ICD-10-CM
